# Patient Record
Sex: Female | Born: 1990
Health system: Southern US, Community
[De-identification: ages and names within clinical notes are randomized; demographics above are authoritative.]

## PROBLEM LIST (undated history)

## (undated) DIAGNOSIS — E079 Disorder of thyroid, unspecified: Secondary | ICD-10-CM

## (undated) DIAGNOSIS — K709 Alcoholic liver disease, unspecified: Secondary | ICD-10-CM

## (undated) DIAGNOSIS — N289 Disorder of kidney and ureter, unspecified: Secondary | ICD-10-CM

## (undated) DIAGNOSIS — R569 Unspecified convulsions: Secondary | ICD-10-CM

## (undated) DIAGNOSIS — D497 Neoplasm of unspecified behavior of endocrine glands and other parts of nervous system: Secondary | ICD-10-CM

## (undated) HISTORY — DX: Unspecified convulsions: R56.9

## (undated) HISTORY — PX: TONSILLECTOMY: SUR1361

## (undated) HISTORY — PX: CHOLECYSTECTOMY: SHX55

## (undated) HISTORY — DX: Alcoholic liver disease, unspecified: K70.9

---

## 2011-05-23 ENCOUNTER — Emergency Department: Payer: Self-pay | Admitting: Internal Medicine

## 2011-06-26 ENCOUNTER — Emergency Department: Payer: Self-pay | Admitting: Internal Medicine

## 2011-07-20 ENCOUNTER — Emergency Department: Payer: Self-pay | Admitting: Internal Medicine

## 2011-07-26 ENCOUNTER — Emergency Department: Payer: Self-pay | Admitting: Emergency Medicine

## 2011-10-13 ENCOUNTER — Emergency Department: Payer: Self-pay | Admitting: Emergency Medicine

## 2011-11-20 ENCOUNTER — Emergency Department: Payer: Self-pay | Admitting: Emergency Medicine

## 2012-01-02 DIAGNOSIS — E0520 Thyrotoxicosis with toxic multinodular goiter without thyrotoxic crisis or storm: Secondary | ICD-10-CM | POA: Insufficient documentation

## 2012-06-08 ENCOUNTER — Emergency Department: Payer: Self-pay | Admitting: Emergency Medicine

## 2013-03-17 ENCOUNTER — Ambulatory Visit: Payer: Self-pay

## 2013-03-17 LAB — URINALYSIS, COMPLETE
Glucose,UR: NEGATIVE mg/dL (ref 0–75)
Ketone: NEGATIVE
Nitrite: NEGATIVE
Ph: 6.5 (ref 4.5–8.0)
Protein: 30
Specific Gravity: >=1.03 (ref 1.003–1.030)

## 2013-03-17 LAB — CBC WITH DIFFERENTIAL/PLATELET
Basophil #: 0 10*3/uL (ref 0.0–0.1)
Basophil %: 0.5 %
Eosinophil #: 0.2 10*3/uL (ref 0.0–0.7)
Eosinophil %: 1.7 %
HCT: 42.2 % (ref 35.0–47.0)
HGB: 14.5 g/dL (ref 12.0–16.0)
Lymphocyte #: 1.5 10*3/uL (ref 1.0–3.6)
Lymphocyte %: 14.6 %
MCH: 29.1 pg (ref 26.0–34.0)
MCHC: 34.3 g/dL (ref 32.0–36.0)
MCV: 85 fL (ref 80–100)
Monocyte #: 0.5 x10 3/mm (ref 0.2–0.9)
Monocyte %: 5 %
Neutrophil #: 7.8 10*3/uL — ABNORMAL HIGH (ref 1.4–6.5)
Neutrophil %: 78.2 %
Platelet: 170 10*3/uL (ref 150–440)
RBC: 4.98 10*6/uL (ref 3.80–5.20)
RDW: 13.5 % (ref 11.5–14.5)
WBC: 10 10*3/uL (ref 3.6–11.0)

## 2013-03-17 LAB — COMPREHENSIVE METABOLIC PANEL
Albumin: 4.1 g/dL (ref 3.4–5.0)
Alkaline Phosphatase: 120 U/L (ref 50–136)
Anion Gap: 13 (ref 7–16)
BUN: 6 mg/dL — ABNORMAL LOW (ref 7–18)
Bilirubin,Total: 0.5 mg/dL (ref 0.2–1.0)
Calcium, Total: 9.2 mg/dL (ref 8.5–10.1)
Chloride: 106 mmol/L (ref 98–107)
Co2: 21 mmol/L (ref 21–32)
Creatinine: 0.67 mg/dL (ref 0.60–1.30)
EGFR (African American): >60
EGFR (Non-African Amer.): >60
Glucose: 112 mg/dL — ABNORMAL HIGH (ref 65–99)
Osmolality: 278 (ref 275–301)
Potassium: 4.7 mmol/L (ref 3.5–5.1)
SGOT(AST): 36 U/L (ref 15–37)
SGPT (ALT): 32 U/L (ref 12–78)
Sodium: 140 mmol/L (ref 136–145)
Total Protein: 7.7 g/dL (ref 6.4–8.2)

## 2013-03-17 LAB — PREGNANCY, URINE: Pregnancy Test, Urine: NEGATIVE m[IU]/mL

## 2013-03-19 LAB — URINE CULTURE

## 2013-04-10 ENCOUNTER — Ambulatory Visit: Payer: Self-pay | Admitting: Internal Medicine

## 2013-04-10 LAB — URINALYSIS, COMPLETE
Bilirubin,UR: NEGATIVE
Glucose,UR: NEGATIVE mg/dL (ref 0–75)
Ketone: NEGATIVE
Leukocyte Esterase: NEGATIVE
Nitrite: NEGATIVE
Ph: 6 (ref 4.5–8.0)
Specific Gravity: >=1.03 (ref 1.003–1.030)
WBC UR: NONE SEEN /HPF (ref 0–5)

## 2013-04-10 LAB — CBC WITH DIFFERENTIAL/PLATELET
Basophil #: 0 10*3/uL (ref 0.0–0.1)
Basophil %: 0.5 %
Eosinophil #: 0.5 10*3/uL (ref 0.0–0.7)
Eosinophil %: 8.1 %
HCT: 42.3 % (ref 35.0–47.0)
HGB: 14.4 g/dL (ref 12.0–16.0)
Lymphocyte #: 2 10*3/uL (ref 1.0–3.6)
Lymphocyte %: 29.7 %
MCH: 29.1 pg (ref 26.0–34.0)
MCHC: 34.1 g/dL (ref 32.0–36.0)
MCV: 85 fL (ref 80–100)
Monocyte #: 0.5 x10 3/mm (ref 0.2–0.9)
Monocyte %: 7.2 %
Neutrophil #: 3.6 10*3/uL (ref 1.4–6.5)
Neutrophil %: 54.5 %
Platelet: 168 10*3/uL (ref 150–440)
RBC: 4.96 10*6/uL (ref 3.80–5.20)
RDW: 13.4 % (ref 11.5–14.5)
WBC: 6.7 10*3/uL (ref 3.6–11.0)

## 2013-04-10 LAB — COMPREHENSIVE METABOLIC PANEL
Albumin: 3.9 g/dL (ref 3.4–5.0)
Alkaline Phosphatase: 109 U/L (ref 50–136)
Anion Gap: 10 (ref 7–16)
BUN: 6 mg/dL — ABNORMAL LOW (ref 7–18)
Bilirubin,Total: 0.4 mg/dL (ref 0.2–1.0)
Calcium, Total: 9.3 mg/dL (ref 8.5–10.1)
Chloride: 106 mmol/L (ref 98–107)
Co2: 24 mmol/L (ref 21–32)
Creatinine: 0.76 mg/dL (ref 0.60–1.30)
EGFR (African American): >60
EGFR (Non-African Amer.): >60
Glucose: 103 mg/dL — ABNORMAL HIGH (ref 65–99)
Osmolality: 277 (ref 275–301)
Potassium: 3.4 mmol/L — ABNORMAL LOW (ref 3.5–5.1)
SGOT(AST): 15 U/L (ref 15–37)
SGPT (ALT): 31 U/L (ref 12–78)
Sodium: 140 mmol/L (ref 136–145)
Total Protein: 7.3 g/dL (ref 6.4–8.2)

## 2013-04-10 LAB — PREGNANCY, URINE: Pregnancy Test, Urine: NEGATIVE m[IU]/mL

## 2013-04-13 DIAGNOSIS — N200 Calculus of kidney: Secondary | ICD-10-CM | POA: Insufficient documentation

## 2013-09-01 HISTORY — PX: OTHER SURGICAL HISTORY: SHX169

## 2013-09-28 ENCOUNTER — Emergency Department: Payer: Self-pay | Admitting: Emergency Medicine

## 2013-09-28 LAB — COMPREHENSIVE METABOLIC PANEL
Albumin: 3.5 g/dL (ref 3.4–5.0)
Alkaline Phosphatase: 101 U/L
Anion Gap: 4 — ABNORMAL LOW (ref 7–16)
BUN: 5 mg/dL — ABNORMAL LOW (ref 7–18)
Bilirubin,Total: 0.3 mg/dL (ref 0.2–1.0)
Calcium, Total: 8.4 mg/dL — ABNORMAL LOW (ref 8.5–10.1)
Chloride: 109 mmol/L — ABNORMAL HIGH (ref 98–107)
Co2: 27 mmol/L (ref 21–32)
Creatinine: 0.71 mg/dL (ref 0.60–1.30)
EGFR (African American): >60
EGFR (Non-African Amer.): >60
Glucose: 58 mg/dL — ABNORMAL LOW (ref 65–99)
Osmolality: 274 (ref 275–301)
Potassium: 4 mmol/L (ref 3.5–5.1)
SGOT(AST): 8 U/L — ABNORMAL LOW (ref 15–37)
SGPT (ALT): 16 U/L (ref 12–78)
Sodium: 140 mmol/L (ref 136–145)
Total Protein: 6.8 g/dL (ref 6.4–8.2)

## 2013-09-28 LAB — CBC WITH DIFFERENTIAL/PLATELET
Basophil #: 0 10*3/uL (ref 0.0–0.1)
Basophil %: 0.5 %
Eosinophil #: 0.5 10*3/uL (ref 0.0–0.7)
Eosinophil %: 10.5 %
HCT: 41.8 % (ref 35.0–47.0)
HGB: 14.5 g/dL (ref 12.0–16.0)
Lymphocyte #: 1.6 10*3/uL (ref 1.0–3.6)
Lymphocyte %: 31.2 %
MCH: 31 pg (ref 26.0–34.0)
MCHC: 34.7 g/dL (ref 32.0–36.0)
MCV: 89 fL (ref 80–100)
Monocyte #: 0.3 x10 3/mm (ref 0.2–0.9)
Monocyte %: 6.3 %
Neutrophil #: 2.7 10*3/uL (ref 1.4–6.5)
Neutrophil %: 51.5 %
Platelet: 163 10*3/uL (ref 150–440)
RBC: 4.68 10*6/uL (ref 3.80–5.20)
RDW: 13.2 % (ref 11.5–14.5)
WBC: 5.2 10*3/uL (ref 3.6–11.0)

## 2013-09-28 LAB — URINALYSIS, COMPLETE
Bacteria: NONE SEEN
Bilirubin,UR: NEGATIVE
Glucose,UR: NEGATIVE mg/dL (ref 0–75)
Ketone: NEGATIVE
Leukocyte Esterase: NEGATIVE
Nitrite: NEGATIVE
Ph: 7 (ref 4.5–8.0)
Protein: NEGATIVE
RBC,UR: 174 /HPF (ref 0–5)
Specific Gravity: 1.015 (ref 1.003–1.030)
Squamous Epithelial: 7
WBC UR: NONE SEEN /HPF (ref 0–5)

## 2013-09-28 LAB — HCG, QUANTITATIVE, PREGNANCY: Beta Hcg, Quant.: <1 m[IU]/mL — ABNORMAL LOW

## 2014-01-16 ENCOUNTER — Encounter: Payer: Self-pay | Admitting: Family Medicine

## 2014-01-30 ENCOUNTER — Encounter: Payer: Self-pay | Admitting: Family Medicine

## 2014-03-13 ENCOUNTER — Encounter (HOSPITAL_COMMUNITY): Payer: Self-pay | Admitting: Emergency Medicine

## 2014-03-13 ENCOUNTER — Emergency Department (HOSPITAL_COMMUNITY): Payer: BC Managed Care – PPO

## 2014-03-13 ENCOUNTER — Emergency Department (HOSPITAL_COMMUNITY)
Admission: EM | Admit: 2014-03-13 | Discharge: 2014-03-14 | Disposition: A | Discharge status: Home / Self Care | Payer: BC Managed Care – PPO | Attending: Emergency Medicine | Admitting: Emergency Medicine

## 2014-03-13 DIAGNOSIS — N83209 Unspecified ovarian cyst, unspecified side: Secondary | ICD-10-CM | POA: Diagnosis not present

## 2014-03-13 DIAGNOSIS — E079 Disorder of thyroid, unspecified: Secondary | ICD-10-CM | POA: Insufficient documentation

## 2014-03-13 DIAGNOSIS — N83202 Unspecified ovarian cyst, left side: Secondary | ICD-10-CM

## 2014-03-13 DIAGNOSIS — N949 Unspecified condition associated with female genital organs and menstrual cycle: Secondary | ICD-10-CM | POA: Insufficient documentation

## 2014-03-13 DIAGNOSIS — Z3202 Encounter for pregnancy test, result negative: Secondary | ICD-10-CM | POA: Insufficient documentation

## 2014-03-13 DIAGNOSIS — Z79899 Other long term (current) drug therapy: Secondary | ICD-10-CM | POA: Diagnosis not present

## 2014-03-13 DIAGNOSIS — IMO0002 Reserved for concepts with insufficient information to code with codable children: Secondary | ICD-10-CM | POA: Diagnosis not present

## 2014-03-13 DIAGNOSIS — F17200 Nicotine dependence, unspecified, uncomplicated: Secondary | ICD-10-CM | POA: Diagnosis not present

## 2014-03-13 DIAGNOSIS — R102 Pelvic and perineal pain: Secondary | ICD-10-CM

## 2014-03-13 DIAGNOSIS — N898 Other specified noninflammatory disorders of vagina: Secondary | ICD-10-CM | POA: Diagnosis present

## 2014-03-13 DIAGNOSIS — N939 Abnormal uterine and vaginal bleeding, unspecified: Secondary | ICD-10-CM

## 2014-03-13 HISTORY — DX: Disorder of thyroid, unspecified: E07.9

## 2014-03-13 LAB — CBC WITH DIFFERENTIAL/PLATELET
Basophils Absolute: 0 10*3/uL (ref 0.0–0.1)
Basophils Relative: 0 % (ref 0–1)
Eosinophils Absolute: 0.5 10*3/uL (ref 0.0–0.7)
Eosinophils Relative: 8 % — ABNORMAL HIGH (ref 0–5)
HCT: 42 % (ref 36.0–46.0)
Hemoglobin: 14.6 g/dL (ref 12.0–15.0)
Lymphocytes Relative: 40 % (ref 12–46)
Lymphs Abs: 2.6 10*3/uL (ref 0.7–4.0)
MCH: 31.1 pg (ref 26.0–34.0)
MCHC: 34.8 g/dL (ref 30.0–36.0)
MCV: 89.6 fL (ref 78.0–100.0)
Monocytes Absolute: 0.5 10*3/uL (ref 0.1–1.0)
Monocytes Relative: 7 % (ref 3–12)
Neutro Abs: 2.9 10*3/uL (ref 1.7–7.7)
Neutrophils Relative %: 45 % (ref 43–77)
Platelets: 181 10*3/uL (ref 150–400)
RBC: 4.69 MIL/uL (ref 3.87–5.11)
RDW: 12.4 % (ref 11.5–15.5)
WBC: 6.5 10*3/uL (ref 4.0–10.5)

## 2014-03-13 LAB — URINALYSIS, ROUTINE W REFLEX MICROSCOPIC
Bilirubin Urine: NEGATIVE
Glucose, UA: NEGATIVE mg/dL
Ketones, ur: NEGATIVE mg/dL
Leukocytes, UA: NEGATIVE
Nitrite: NEGATIVE
Specific Gravity, Urine: 1.015 (ref 1.005–1.030)
Urobilinogen, UA: 1 mg/dL (ref 0.0–1.0)
pH: 6 (ref 5.0–8.0)

## 2014-03-13 LAB — WET PREP, GENITAL
Trich, Wet Prep: NONE SEEN
Yeast Wet Prep HPF POC: NONE SEEN

## 2014-03-13 LAB — URINE MICROSCOPIC-ADD ON

## 2014-03-13 LAB — HCG, QUANTITATIVE, PREGNANCY: hCG, Beta Chain, Quant, S: <1 m[IU]/mL (ref <5)

## 2014-03-13 LAB — POC URINE PREG, ED: Preg Test, Ur: NEGATIVE

## 2014-03-13 LAB — ABO/RH: ABO/RH(D): B POS

## 2014-03-13 MED ORDER — KETOROLAC TROMETHAMINE 60 MG/2ML IM SOLN
30.0000 mg | Freq: Once | INTRAMUSCULAR | Status: DC
  Filled 2014-03-13: qty 2

## 2014-03-13 MED ORDER — HYDROCODONE-ACETAMINOPHEN 5-325 MG PO TABS
1.0000 | ORAL_TABLET | Freq: Once | ORAL | Status: AC
  Administered 2014-03-13: 1 via ORAL
  Filled 2014-03-13: qty 1

## 2014-03-13 NOTE — ED Provider Notes (Signed)
CSN: [id number]     Arrival date & time 03/13/14  2126 History   First MD Initiated Contact with Patient 03/13/14 2140     Chief Complaint  Patient presents with  . Vaginal Bleeding  . Flank Pain     (Consider location/radiation/quality/duration/timing/severity/associated sxs/prior Treatment) Patient is a 23 y.o. female presenting with vaginal bleeding and flank pain. The history is provided by the patient.  Vaginal Bleeding Quality:  Bright red Severity:  Moderate Onset quality:  Gradual Duration:  12 hours Timing:  Constant Progression:  Worsening Chronicity:  New Number of pads used:  6 pads today Possible pregnancy: no   Context: spontaneously   Relieved by:  None tried Associated symptoms: abdominal pain   Flank Pain Associated symptoms include abdominal pain.   Kelsey Donovan is a 23 y.o. female who presents to the ED with vaginal bleeding. She states that pain started about a week ago. She had been on Depo Provera for 6 years and her last shot was 10/2013. She had a normal period the first week in May that lasted a week and then again in June and July. Then the bleeding started back this morning.  Past Medical History  Diagnosis Date  . Thyroid disease    Past Surgical History  Procedure Laterality Date  . Tonsillectomy    . Gastric bybpass     History reviewed. No pertinent family history. History  Substance Use Topics  . Smoking status: Current Every Day Smoker  . Smokeless tobacco: Not on file  . Alcohol Use: No   OB History   Grav Para Term Preterm Abortions TAB SAB Ect Mult Living                 Review of Systems  Gastrointestinal: Positive for abdominal pain.  Genitourinary: Positive for flank pain, vaginal bleeding and pelvic pain.  All other systems negative    Allergies  Ibuprofen  Home Medications   Prior to Admission medications   Medication Sig Start Date End Date Taking? Authorizing Provider  acetaminophen (TYLENOL) 500 MG tablet  Take 1,000 mg by mouth every 6 (six) hours as needed for moderate pain.   Yes Historical Provider, MD  amphetamine-dextroamphetamine (ADDERALL) 20 MG tablet Take 1 tablet by mouth 2 (two) times daily. 02/23/14  Yes Historical Provider, MD  CALCIUM PO Take 1 tablet by mouth daily.   Yes Historical Provider, MD  citalopram (CELEXA) 20 MG tablet Take 1 tablet by mouth daily. 02/23/14  Yes Historical Provider, MD  fluticasone (FLONASE) 50 MCG/ACT nasal spray Place 2 sprays into both nostrils daily. 01/19/14  Yes Historical Provider, MD  folic acid (FOLVITE) 1 MG tablet Take 1 tablet by mouth daily. 12/21/13  Yes Historical Provider, MD  levothyroxine (SYNTHROID, LEVOTHROID) 125 MCG tablet Take 125 mcg by mouth daily before breakfast.   Yes Historical Provider, MD  methocarbamol (ROBAXIN) 750 MG tablet Take 1 tablet by mouth daily. 02/23/14  Yes Historical Provider, MD  Multiple Vitamin (MULTIVITAMIN WITH MINERALS) TABS tablet Take 2 tablets by mouth daily.   Yes Historical Provider, MD  Vitamin D, Ergocalciferol, (DRISDOL) 50000 UNITS CAPS capsule Take 50,000 Units by mouth every 7 (seven) days. On Wednesday.   Yes Historical Provider, MD   BP 97/53  Pulse 81  Temp(Src) 98.2 F (36.8 C) (Oral)  Resp 24  Ht 5\' 2"  (1.575 m)  Wt 145 lb (65.772 kg)  BMI 26.51 kg/m2  SpO2 99%  LMP 03/13/2014 Physical Exam  Nursing note and  vitals reviewed. Constitutional: She is oriented to person, place, and time. She appears well-developed and well-nourished.  HENT:  Head: Normocephalic.  Eyes: Conjunctivae and EOM are normal.  Neck: Neck supple.  Cardiovascular: Normal rate and regular rhythm.   Pulmonary/Chest: Effort normal. She has no wheezes. She has no rales.  Abdominal: Soft. There is no tenderness.  Genitourinary:  External genitalia without lesions, small blood vaginal vault. Yellow blood tinged mucous discharge from cervix. Positive CMT, bilateral adnexal tenderness, uterus without palpable enlargement.    Musculoskeletal: Normal range of motion.  Neurological: She is alert and oriented to person, place, and time. No cranial nerve deficit.  Skin: Skin is warm and dry.  Psychiatric: She has a normal mood and affect. Her behavior is normal.    ED Course  Procedures (including critical care time) Labs Review Results for orders placed during the hospital encounter of 03/13/14 (from the past 24 hour(s))  CBC WITH DIFFERENTIAL     Status: Abnormal   Collection Time    03/13/14  9:53 PM      Result Value Ref Range   WBC 6.5  4.0 - 10.5 K/uL   RBC 4.69  3.87 - 5.11 MIL/uL   Hemoglobin 14.6  12.0 - 15.0 g/dL   HCT 42.0  36.0 - 46.0 %   MCV 89.6  78.0 - 100.0 fL   MCH 31.1  26.0 - 34.0 pg   MCHC 34.8  30.0 - 36.0 g/dL   RDW 12.4  11.5 - 15.5 %   Platelets 181  150 - 400 K/uL   Neutrophils Relative % 45  43 - 77 %   Neutro Abs 2.9  1.7 - 7.7 K/uL   Lymphocytes Relative 40  12 - 46 %   Lymphs Abs 2.6  0.7 - 4.0 K/uL   Monocytes Relative 7  3 - 12 %   Monocytes Absolute 0.5  0.1 - 1.0 K/uL   Eosinophils Relative 8 (*) 0 - 5 %   Eosinophils Absolute 0.5  0.0 - 0.7 K/uL   Basophils Relative 0  0 - 1 %   Basophils Absolute 0.0  0.0 - 0.1 K/uL  HCG, QUANTITATIVE, PREGNANCY     Status: None   Collection Time    03/13/14  9:53 PM      Result Value Ref Range   hCG, Beta Chain, Quant, S <1  <5 mIU/mL  ABO/RH     Status: None   Collection Time    03/13/14  9:55 PM      Result Value Ref Range   ABO/RH(D) B POS    URINALYSIS, ROUTINE W REFLEX MICROSCOPIC     Status: Abnormal   Collection Time    03/13/14 10:12 PM      Result Value Ref Range   Color, Urine YELLOW  YELLOW   APPearance CLEAR  CLEAR   Specific Gravity, Urine 1.015  1.005 - 1.030   pH 6.0  5.0 - 8.0   Glucose, UA NEGATIVE  NEGATIVE mg/dL   Hgb urine dipstick LARGE (*) NEGATIVE   Bilirubin Urine NEGATIVE  NEGATIVE   Ketones, ur NEGATIVE  NEGATIVE mg/dL   Protein, ur TRACE (*) NEGATIVE mg/dL   Urobilinogen, UA 1.0  0.0 -  1.0 mg/dL   Nitrite NEGATIVE  NEGATIVE   Leukocytes, UA NEGATIVE  NEGATIVE  URINE MICROSCOPIC-ADD ON     Status: Abnormal   Collection Time    03/13/14 10:12 PM      Result Value Ref Range  Squamous Epithelial / LPF RARE  RARE   WBC, UA 0-2  <3 WBC/hpf   RBC / HPF TOO NUMEROUS TO COUNT  <3 RBC/hpf   Bacteria, UA FEW (*) RARE  POC URINE PREG, ED     Status: None   Collection Time    03/13/14 10:14 PM      Result Value Ref Range   Preg Test, Ur NEGATIVE  NEGATIVE  WET PREP, GENITAL     Status: Abnormal   Collection Time    03/13/14 10:29 PM      Result Value Ref Range   Yeast Wet Prep HPF POC NONE SEEN  NONE SEEN   Trich, Wet Prep NONE SEEN  NONE SEEN   Clue Cells Wet Prep HPF POC FEW (*) NONE SEEN   WBC, Wet Prep HPF POC FEW (*) NONE SEEN    Ct Abdomen Pelvis Wo Contrast  03/14/2014   CLINICAL DATA:  Vaginal bleeding.  Flank pain.  EXAM: CT ABDOMEN AND PELVIS WITHOUT CONTRAST  TECHNIQUE: Multidetector CT imaging of the abdomen and pelvis was performed following the standard protocol without IV contrast.  COMPARISON:  Prior CT from 09/28/2013  FINDINGS: The visualized lung bases are clear.  Limited noncontrast evaluation of the liver is unremarkable. Gallbladder within normal limits. No biliary ductal dilatation. The spleen, adrenal glands, and pancreas demonstrate a normal unenhanced appearance.  Kidneys are within normal limits without evidence of nephrolithiasis or hydronephrosis. No stones seen along the course of either renal collecting system. There is no hydroureter.  Sequelae of prior Roux-en-Y gastric bypass noted. No evidence of bowel obstruction. No acute inflammatory changes seen within the right lower quadrant or about the cecum to suggest acute appendicitis. No abnormal wall thickening or inflammatory fat stranding seen about the bowels.  Bladder within normal limits. Uterus is unremarkable. Probable physiologic 3.2 cm cyst present within the left ovary. Right ovary is  unremarkable.  Small volume free fluid present within the pelvic cul-de-sac, likely physiologic. No free intraperitoneal air. No adenopathy.  No acute osseus abnormality. No worrisome lytic or blastic osseous lesions.  IMPRESSION: 1. No CT evidence of nephrolithiasis or obstructive uropathy. 2. 3.2 cm left ovarian cyst with small volume free fluid within the pelvis, likely physiologic. 3. No other acute intra-abdominal pelvic process.   Electronically Signed   By: Jeannine Boga M.D.   On: 03/14/2014 00:39    MDM  23 y.o. female with pelvic pain and vaginal bleeding after stopping Depo Provera 10/2013. Will treat for possible PID while cultures pending. Will treat for pain. She is to follow up with her GYN to discuss her abnormal bleeding that is possibly related to stopping the Depo. Stable for discharge without heavy bleeding, fever, dizziness or other problems. I have reviewed this patient's vital signs, nurses notes, appropriate labs and imaging.  I have discussed findings and plan of care with the patient and she voices understanding and agrees with the plan.    Medication List    TAKE these medications       HYDROcodone-acetaminophen 5-325 MG per tablet  Commonly known as:  NORCO/VICODIN  Take 1 tablet by mouth every 4 (four) hours as needed.     metroNIDAZOLE 500 MG tablet  Commonly known as:  FLAGYL  Take 1 tablet (500 mg total) by mouth 2 (two) times daily.     ondansetron 4 MG disintegrating tablet  Commonly known as:  ZOFRAN ODT  Take 1 tablet (4 mg total) by mouth every 8 (  eight) hours as needed for nausea or vomiting.      ASK your doctor about these medications       acetaminophen 500 MG tablet  Commonly known as:  TYLENOL  Take 1,000 mg by mouth every 6 (six) hours as needed for moderate pain.     amphetamine-dextroamphetamine 20 MG tablet  Commonly known as:  ADDERALL  Take 1 tablet by mouth 2 (two) times daily.     CALCIUM PO  Take 1 tablet by mouth daily.      citalopram 20 MG tablet  Commonly known as:  CELEXA  Take 1 tablet by mouth daily.     fluticasone 50 MCG/ACT nasal spray  Commonly known as:  FLONASE  Place 2 sprays into both nostrils daily.     folic acid 1 MG tablet  Commonly known as:  FOLVITE  Take 1 tablet by mouth daily.     levothyroxine 125 MCG tablet  Commonly known as:  SYNTHROID, LEVOTHROID  Take 125 mcg by mouth daily before breakfast.     methocarbamol 750 MG tablet  Commonly known as:  ROBAXIN  Take 1 tablet by mouth daily.     multivitamin with minerals Tabs tablet  Take 2 tablets by mouth daily.     Vitamin D (Ergocalciferol) 50000 UNITS Caps capsule  Commonly known as:  DRISDOL  Take 50,000 Units by mouth every 7 (seven) days. On Wednesday.           [place], [place] 03/15/14 [phone]

## 2014-03-13 NOTE — ED Notes (Signed)
Pt having nausea x 2 week, last week pain in back radiating around to right side, Today heavy vaginal bleeding.

## 2014-03-14 LAB — RPR

## 2014-03-14 LAB — HIV ANTIBODY (ROUTINE TESTING W REFLEX): HIV 1&2 Ab, 4th Generation: NONREACTIVE

## 2014-03-14 MED ORDER — METRONIDAZOLE 500 MG PO TABS: 500.0000 mg | ORAL_TABLET | Freq: Two times a day (BID) | ORAL | Status: DC

## 2014-03-14 MED ORDER — ONDANSETRON 4 MG PO TBDP: 4.0000 mg | ORAL_TABLET | Freq: Three times a day (TID) | ORAL | Status: DC | PRN

## 2014-03-14 MED ORDER — HYDROCODONE-ACETAMINOPHEN 5-325 MG PO TABS: 1.0000 | ORAL_TABLET | ORAL | Status: DC | PRN

## 2014-03-14 MED ORDER — ONDANSETRON 4 MG PO TBDP
4.0000 mg | ORAL_TABLET | Freq: Once | ORAL | Status: AC
  Administered 2014-03-14: 4 mg via ORAL

## 2014-03-14 MED ORDER — ONDANSETRON 4 MG PO TBDP
4.0000 mg | ORAL_TABLET | Freq: Three times a day (TID) | ORAL | Status: DC | PRN
Start: 2014-03-14 — End: 2014-06-23

## 2014-03-14 MED ORDER — AZITHROMYCIN 250 MG PO TABS
1000.0000 mg | ORAL_TABLET | Freq: Every day | ORAL | Status: DC
  Filled 2014-03-14: qty 4

## 2014-03-14 MED ORDER — LIDOCAINE HCL (PF) 1 % IJ SOLN
INTRAMUSCULAR | Status: AC
  Administered 2014-03-14: 0.9 mL
  Filled 2014-03-14: qty 5

## 2014-03-14 MED ORDER — AZITHROMYCIN 250 MG PO TABS
1000.0000 mg | ORAL_TABLET | Freq: Once | ORAL | Status: AC
  Administered 2014-03-14: 1000 mg via ORAL

## 2014-03-14 MED ORDER — CEFTRIAXONE SODIUM 250 MG IJ SOLR
250.0000 mg | Freq: Once | INTRAMUSCULAR | Status: AC
  Administered 2014-03-14: 250 mg via INTRAMUSCULAR
  Filled 2014-03-14: qty 250

## 2014-03-14 MED ORDER — ONDANSETRON 4 MG PO TBDP
ORAL_TABLET | ORAL | Status: AC
  Filled 2014-03-14: qty 1

## 2014-03-14 NOTE — Discharge Instructions (Signed)
Dysfunctional Uterine Bleeding Normally, menstrual periods begin between ages 11 to 17 in young women. A normal menstrual cycle/period may begin every 23 days up to 35 days and lasts from 1 to 7 days. Around 12 to 14 days before your menstrual period starts, ovulation (ovary produces an egg) occurs. When counting the time between menstrual periods, count from the first day of bleeding of the previous period to the first day of bleeding of the next period. Dysfunctional (abnormal) uterine bleeding is bleeding that is different from a normal menstrual period. Your periods may come earlier or later than usual. They may be lighter, have blood clots or be heavier. You may have bleeding between periods, or you may skip one period or more. You may have bleeding after sexual intercourse, bleeding after menopause, or no menstrual period. CAUSES   Pregnancy (normal, miscarriage, tubal).  IUDs (intrauterine device, birth control).  Birth control pills.  Hormone treatment.  Menopause.  Infection of the cervix.  Blood clotting problems.  Infection of the inside lining of the uterus.  Endometriosis, inside lining of the uterus growing in the pelvis and other female organs.  Adhesions (scar tissue) inside the uterus.  Obesity or severe weight loss.  Uterine polyps inside the uterus.  Cancer of the vagina, cervix, or uterus.  Ovarian cysts or polycystic ovary syndrome.  Medical problems (diabetes, thyroid disease).  Uterine fibroids (noncancerous tumor).  Problems with your female hormones.  Endometrial hyperplasia, very thick lining and enlarged cells inside of the uterus.  Medicines that interfere with ovulation.  Radiation to the pelvis or abdomen.  Chemotherapy. DIAGNOSIS   Your doctor will discuss the history of your menstrual periods, medicines you are taking, changes in your weight, stress in your life, and any medical problems you may have.  Your doctor will do a physical  and pelvic examination.  Your doctor may want to perform certain tests to make a diagnosis, such as:  Pap test.  Blood tests.  Cultures for infection.  CT scan.  Ultrasound.  Hysteroscopy.  Laparoscopy.  MRI.  Hysterosalpingography.  D and C.  Endometrial biopsy. TREATMENT  Treatment will depend on the cause of the dysfunctional uterine bleeding (DUB). Treatment may include:  Observing your menstrual periods for a couple of months.  Prescribing medicines for medical problems, including:  Antibiotics.  Hormones.  Birth control pills.  Removing an IUD (intrauterine device, birth control).  Surgery:  D and C (scrape and remove tissue from inside the uterus).  Laparoscopy (examine inside the abdomen with a lighted tube).  Uterine ablation (destroy lining of the uterus with electrical current, laser, heat, or freezing).  Hysteroscopy (examine cervix and uterus with a lighted tube).  Hysterectomy (remove the uterus). HOME CARE INSTRUCTIONS   If medicines were prescribed, take exactly as directed. Do not change or switch medicines without consulting your caregiver.  Long term heavy bleeding may result in iron deficiency. Your caregiver may have prescribed iron pills. They help replace the iron that your body lost from heavy bleeding. Take exactly as directed.  Do not take aspirin or medicines that contain aspirin one week before or during your menstrual period. Aspirin may make the bleeding worse.  If you need to change your sanitary pad or tampon more than once every 2 hours, stay in bed with your feet elevated and a cold pack on your lower abdomen. Rest as much as possible, until the bleeding stops or slows down.  Eat well-balanced meals. Eat foods high in iron. Examples   are:  Leafy green vegetables.  Whole-grain breads and cereals.  Eggs.  Meat.  Liver.  Do not try to lose weight until the abnormal bleeding has stopped and your blood iron level is  back to normal. Do not lift more than ten pounds or do strenuous activities when you are bleeding.  For a couple of months, make note on your calendar, marking the start and ending of your period, and the type of bleeding (light, medium, heavy, spotting, clots or missed periods). This is for your caregiver to better evaluate your problem. SEEK MEDICAL CARE IF:   You develop nausea (feeling sick to your stomach) and vomiting, dizziness, or diarrhea while you are taking your medicine.  You are getting lightheaded or weak.  You have any problems that may be related to the medicine you are taking.  You develop pain with your DUB.  You want to remove your IUD.  You want to stop or change your birth control pills or hormones.  You have any type of abnormal bleeding mentioned above.  You are over 16 years old and have not had a menstrual period yet.  You are 23 years old and you are still having menstrual periods.  You have any of the symptoms mentioned above.  You develop a rash. SEEK IMMEDIATE MEDICAL CARE IF:   An oral temperature above 102 F (38.9 C) develops.  You develop chills.  You are changing your sanitary pad or tampon more than once an hour.  You develop abdominal pain.  You pass out or faint. Document Released: 08/15/2000 Document Revised: 11/10/2011 Document Reviewed: 07/17/2009 Exit[employer] Patient Information 2015 [employer]. This information is not intended to replace advice given to you by your health care provider. Make sure you discuss any questions you have with your health care provider.  

## 2014-03-15 LAB — GC/CHLAMYDIA PROBE AMP
CT Probe RNA: NEGATIVE
GC Probe RNA: NEGATIVE

## 2014-03-16 NOTE — ED Provider Notes (Signed)
Medical screening examination/treatment/procedure(s) were performed by non-physician practitioner and as supervising physician I was immediately available for consultation/collaboration.   EKG Interpretation None        Alfonzo Feller, DO 03/16/14 1430

## 2014-03-20 MED FILL — Ondansetron HCl Tab 4 MG: ORAL | Qty: 4 | Status: AC

## 2014-06-23 ENCOUNTER — Emergency Department (HOSPITAL_COMMUNITY): Payer: BC Managed Care – PPO

## 2014-06-23 ENCOUNTER — Emergency Department (HOSPITAL_COMMUNITY)
Admission: EM | Admit: 2014-06-23 | Discharge: 2014-06-23 | Disposition: A | Discharge status: Home / Self Care | Payer: BC Managed Care – PPO | Attending: Emergency Medicine | Admitting: Emergency Medicine

## 2014-06-23 ENCOUNTER — Encounter (HOSPITAL_COMMUNITY): Payer: Self-pay | Admitting: Emergency Medicine

## 2014-06-23 DIAGNOSIS — E079 Disorder of thyroid, unspecified: Secondary | ICD-10-CM | POA: Diagnosis not present

## 2014-06-23 DIAGNOSIS — Z79899 Other long term (current) drug therapy: Secondary | ICD-10-CM | POA: Diagnosis not present

## 2014-06-23 DIAGNOSIS — Z7951 Long term (current) use of inhaled steroids: Secondary | ICD-10-CM | POA: Diagnosis not present

## 2014-06-23 DIAGNOSIS — R0789 Other chest pain: Secondary | ICD-10-CM | POA: Diagnosis not present

## 2014-06-23 DIAGNOSIS — R0981 Nasal congestion: Secondary | ICD-10-CM | POA: Diagnosis not present

## 2014-06-23 DIAGNOSIS — R05 Cough: Secondary | ICD-10-CM | POA: Diagnosis present

## 2014-06-23 DIAGNOSIS — Z720 Tobacco use: Secondary | ICD-10-CM | POA: Insufficient documentation

## 2014-06-23 DIAGNOSIS — J9801 Acute bronchospasm: Secondary | ICD-10-CM

## 2014-06-23 MED ORDER — AZITHROMYCIN 250 MG PO TABS: 250.0000 mg | ORAL_TABLET | Freq: Every day | ORAL | Status: DC

## 2014-06-23 MED ORDER — IPRATROPIUM-ALBUTEROL 0.5-2.5 (3) MG/3ML IN SOLN
3.0000 mL | Freq: Once | RESPIRATORY_TRACT | Status: AC
  Administered 2014-06-23: 3 mL via RESPIRATORY_TRACT
  Filled 2014-06-23: qty 3

## 2014-06-23 MED ORDER — ALBUTEROL SULFATE HFA 108 (90 BASE) MCG/ACT IN AERS
INHALATION_SPRAY | RESPIRATORY_TRACT | Status: AC
  Administered 2014-06-23: 2 via RESPIRATORY_TRACT
  Filled 2014-06-23: qty 6.7

## 2014-06-23 MED ORDER — PREDNISONE 50 MG PO TABS
60.0000 mg | ORAL_TABLET | Freq: Once | ORAL | Status: AC
  Administered 2014-06-23: 60 mg via ORAL
  Filled 2014-06-23 (×2): qty 1

## 2014-06-23 MED ORDER — GUAIFENESIN-CODEINE 100-10 MG/5ML PO SYRP: 10.0000 mL | ORAL_SOLUTION | Freq: Three times a day (TID) | ORAL | Status: DC | PRN

## 2014-06-23 MED ORDER — ALBUTEROL SULFATE (2.5 MG/3ML) 0.083% IN NEBU
2.5000 mg | INHALATION_SOLUTION | Freq: Once | RESPIRATORY_TRACT | Status: AC
  Administered 2014-06-23: 2.5 mg via RESPIRATORY_TRACT
  Filled 2014-06-23: qty 3

## 2014-06-23 MED ORDER — PREDNISONE 10 MG PO TABS: ORAL_TABLET | ORAL | Status: DC

## 2014-06-23 MED ORDER — ALBUTEROL SULFATE HFA 108 (90 BASE) MCG/ACT IN AERS
2.0000 | INHALATION_SPRAY | Freq: Once | RESPIRATORY_TRACT | Status: AC
  Administered 2014-06-23: 2 via RESPIRATORY_TRACT

## 2014-06-23 NOTE — ED Provider Notes (Signed)
CSN: [id number]     Arrival date & time 06/23/14  0908 History   First MD Initiated Contact with Patient 06/23/14 0915     Chief Complaint  Patient presents with  . Cough     (Consider location/radiation/quality/duration/timing/severity/associated sxs/prior Treatment) HPI   Annarose L Maldonado is a 23 y.o. female who presents to the Emergency Department complaining of cough and nasal congestion for four days.  She states the nasal congestion seems to be improving after OTC cold medications, but cough has been persistent and occassionally productive of yellow sputum.  She also reports chest tightness and wheezes that worsens with excessive coughing or deep breathing.  She denies shortness of breath, fever, chills, vomiting, or bloody sputum.  She reports h/o asthma and uses a maintanince inhale, but has ran out of her albuterol.    Past Medical History  Diagnosis Date  . Thyroid disease    Past Surgical History  Procedure Laterality Date  . Tonsillectomy    . Gastric bybpass     No family history on file. History  Substance Use Topics  . Smoking status: Current Every Day Smoker  . Smokeless tobacco: Not on file  . Alcohol Use: No   OB History   Grav Para Term Preterm Abortions TAB SAB Ect Mult Living                 Review of Systems  Constitutional: Negative for activity change and appetite change.  HENT: Positive for congestion. Negative for facial swelling and trouble swallowing.   Eyes: Negative for visual disturbance.  Respiratory: Positive for chest tightness. Negative for stridor.   Gastrointestinal: Negative for nausea and vomiting.  Genitourinary: Negative for dysuria.  Musculoskeletal: Negative for neck pain and neck stiffness.  Skin: Negative.   Neurological: Negative for dizziness, weakness and numbness.  Hematological: Negative for adenopathy.  Psychiatric/Behavioral: Negative for confusion.  All other systems reviewed and are negative.     Allergies   Ibuprofen  Home Medications   Prior to Admission medications   Medication Sig Start Date End Date Taking? Authorizing Provider  acetaminophen (TYLENOL) 500 MG tablet Take 1,000 mg by mouth every 6 (six) hours as needed for moderate pain.    Historical Provider, MD  amphetamine-dextroamphetamine (ADDERALL) 20 MG tablet Take 1 tablet by mouth 2 (two) times daily. 02/23/14   Historical Provider, MD  CALCIUM PO Take 1 tablet by mouth daily.    Historical Provider, MD  citalopram (CELEXA) 20 MG tablet Take 1 tablet by mouth daily. 02/23/14   Historical Provider, MD  fluticasone (FLONASE) 50 MCG/ACT nasal spray Place 2 sprays into both nostrils daily. 01/19/14   Historical Provider, MD  folic acid (FOLVITE) 1 MG tablet Take 1 tablet by mouth daily. 12/21/13   Historical Provider, MD  HYDROcodone-acetaminophen (NORCO/VICODIN) 5-325 MG per tablet Take 1 tablet by mouth every 4 (four) hours as needed. 1/57/26   Delora Fuel, MD  levothyroxine (SYNTHROID, LEVOTHROID) 125 MCG tablet Take 125 mcg by mouth daily before breakfast.    Historical Provider, MD  methocarbamol (ROBAXIN) 750 MG tablet Take 1 tablet by mouth daily. 02/23/14   Historical Provider, MD  metroNIDAZOLE (FLAGYL) 500 MG tablet Take 1 tablet (500 mg total) by mouth 2 (two) times daily. 10/04/53   Delora Fuel, MD  Multiple Vitamin (MULTIVITAMIN WITH MINERALS) TABS tablet Take 2 tablets by mouth daily.    Historical Provider, MD  ondansetron (ZOFRAN ODT) 4 MG disintegrating tablet Take 1 tablet (4 mg total) by  mouth every 8 (eight) hours as needed for nausea or vomiting. 2/63/33   Delora Fuel, MD  Vitamin D, Ergocalciferol, (DRISDOL) 50000 UNITS CAPS capsule Take 50,000 Units by mouth every 7 (seven) days. On Wednesday.    Historical Provider, MD   BP 101/66  Pulse 80  Temp(Src) 98.1 F (36.7 C) (Oral)  Resp 18  Ht 5\' 3"  (1.6 m)  Wt 140 lb (63.504 kg)  BMI 24.81 kg/m2  SpO2 100%  LMP 06/15/2014 Physical Exam  Nursing note and vitals  reviewed. Constitutional: She is oriented to person, place, and time. She appears well-developed and well-nourished. No distress.  HENT:  Head: Normocephalic and atraumatic.  Right Ear: Tympanic membrane and ear canal normal.  Left Ear: Tympanic membrane and ear canal normal.  Mouth/Throat: Uvula is midline, oropharynx is clear and moist and mucous membranes are normal. No oropharyngeal exudate.  Eyes: EOM are normal. Pupils are equal, round, and reactive to light.  Neck: Normal range of motion, full passive range of motion without pain and phonation normal. Neck supple.  Cardiovascular: Normal rate, regular rhythm, normal heart sounds and intact distal pulses.   No murmur heard. Pulmonary/Chest: Effort normal. No stridor. No respiratory distress. She has wheezes. She has no rales. She exhibits no tenderness.  Breath sounds diminished bilaterally with inspiratory and expiratory wheezes. No rales  Musculoskeletal: Normal range of motion. She exhibits no edema.  Lymphadenopathy:    She has no cervical adenopathy.  Neurological: She is alert and oriented to person, place, and time. She exhibits normal muscle tone. Coordination normal.  Skin: Skin is warm and dry.    ED Course  Procedures (including critical care time) Labs Review Labs Reviewed - No data to display  Imaging Review Dg Chest 2 View  06/23/2014   CLINICAL DATA:  Cough, fever.  EXAM: CHEST  2 VIEW  COMPARISON:  October 13, 2011.  FINDINGS: The heart size and mediastinal contours are within normal limits. Both lungs are clear. No pneumothorax or pleural effusion is noted. The visualized skeletal structures are unremarkable.  IMPRESSION: No acute cardiopulmonary abnormality seen.   Electronically Signed   By: Sabino Dick M.D.   On: 06/23/2014 09:37     EKG Interpretation None      MDM   Final diagnoses:  Cough due to bronchospasm    Pt is feeling better, wheezing and lung sounds improved after neb.  Inhaler  dispensed for home use.  Rx for prednisone, z pack and robitussin AC for cough.  VSS.  Clinical suspicion for PE is low.  Pt appears stable for d/c and agrees to return here for any worsening sx's.    Melanye Hiraldo L. Vanessa [place], PA-C 06/24/14 2103

## 2014-06-23 NOTE — ED Notes (Signed)
RT paged.

## 2014-06-23 NOTE — ED Notes (Signed)
Cough x 4 days. Fever last night. Took Tylenol at 0400

## 2014-06-27 NOTE — ED Provider Notes (Signed)
Medical screening examination/treatment/procedure(s) were performed by non-physician practitioner and as supervising physician I was immediately available for consultation/collaboration.   EKG Interpretation None        Maudry Diego, MD 06/27/14 [phone]

## 2014-10-14 ENCOUNTER — Emergency Department (HOSPITAL_COMMUNITY)
Admission: EM | Admit: 2014-10-14 | Discharge: 2014-10-14 | Disposition: A | Discharge status: Home / Self Care | Payer: Self-pay | Attending: Emergency Medicine | Admitting: Emergency Medicine

## 2014-10-14 ENCOUNTER — Encounter (HOSPITAL_COMMUNITY): Payer: Self-pay

## 2014-10-14 DIAGNOSIS — M62830 Muscle spasm of back: Secondary | ICD-10-CM | POA: Insufficient documentation

## 2014-10-14 DIAGNOSIS — Z79899 Other long term (current) drug therapy: Secondary | ICD-10-CM | POA: Insufficient documentation

## 2014-10-14 DIAGNOSIS — Z792 Long term (current) use of antibiotics: Secondary | ICD-10-CM | POA: Insufficient documentation

## 2014-10-14 DIAGNOSIS — E079 Disorder of thyroid, unspecified: Secondary | ICD-10-CM | POA: Insufficient documentation

## 2014-10-14 DIAGNOSIS — Z720 Tobacco use: Secondary | ICD-10-CM | POA: Insufficient documentation

## 2014-10-14 LAB — URINALYSIS, ROUTINE W REFLEX MICROSCOPIC
Bilirubin Urine: NEGATIVE
Glucose, UA: NEGATIVE mg/dL
Ketones, ur: NEGATIVE mg/dL
Leukocytes, UA: NEGATIVE
Nitrite: NEGATIVE
Protein, ur: NEGATIVE mg/dL
Specific Gravity, Urine: >1.03 — ABNORMAL HIGH (ref 1.005–1.030)
Urobilinogen, UA: 0.2 mg/dL (ref 0.0–1.0)
pH: 5.5 (ref 5.0–8.0)

## 2014-10-14 LAB — URINE MICROSCOPIC-ADD ON

## 2014-10-14 LAB — PREGNANCY, URINE: Preg Test, Ur: NEGATIVE

## 2014-10-14 MED ORDER — ONDANSETRON 4 MG PO TBDP
ORAL_TABLET | ORAL | Status: AC
  Administered 2014-10-14: 4 mg
  Filled 2014-10-14: qty 1

## 2014-10-14 MED ORDER — TRAMADOL HCL 50 MG PO TABS: 100.0000 mg | ORAL_TABLET | Freq: Four times a day (QID) | ORAL | Status: DC | PRN

## 2014-10-14 MED ORDER — DEXAMETHASONE SODIUM PHOSPHATE 4 MG/ML IJ SOLN
10.0000 mg | Freq: Once | INTRAMUSCULAR | Status: AC
  Administered 2014-10-14: 10 mg via INTRAMUSCULAR
  Filled 2014-10-14: qty 3

## 2014-10-14 MED ORDER — FENTANYL CITRATE 0.05 MG/ML IJ SOLN
50.0000 ug | Freq: Once | INTRAMUSCULAR | Status: AC
  Administered 2014-10-14: 50 ug via INTRAMUSCULAR
  Filled 2014-10-14: qty 2

## 2014-10-14 MED ORDER — DIAZEPAM 5 MG/ML IJ SOLN
10.0000 mg | Freq: Once | INTRAMUSCULAR | Status: AC
  Administered 2014-10-14: 10 mg via INTRAMUSCULAR
  Filled 2014-10-14: qty 2

## 2014-10-14 MED ORDER — CYCLOBENZAPRINE HCL 10 MG PO TABS: 10.0000 mg | ORAL_TABLET | Freq: Three times a day (TID) | ORAL | Status: DC | PRN

## 2014-10-14 MED ORDER — PREDNISONE 20 MG PO TABS: ORAL_TABLET | ORAL | Status: DC

## 2014-10-14 NOTE — ED Notes (Signed)
Pt c/o right lower back pain since yesterday, states she has frequency with urination, denies dysuria

## 2014-10-14 NOTE — Discharge Instructions (Signed)
Use ice and heat on the sore back muscles. Take the medications as prescribed with acetaminophen 1000 mg 4 times a day. Have your doctor recheck you if you aren't improving in the next week.

## 2014-10-14 NOTE — ED Provider Notes (Signed)
CSN: [id number]     Arrival date & time 10/14/14  0539 History   First MD Initiated Contact with Patient 10/14/14 [phone]     Chief Complaint  Patient presents with  . Back Pain     (Consider location/radiation/quality/duration/timing/severity/associated sxs/prior Treatment) HPI  Patient lives in [place]. Patient reports about 5 PM she started getting pain in the middle of her back that radiates to her right lateral\abdomen. She states bending, lifting, and turning makes the pain worse. Laying on her back or elevating her back makes it feel better. She denies any known injury but she works at [employer] place and does a lot of heavy lifting. The pain does not radiate. She describes the pain as sharp. She has had some nausea without vomiting. She states she's had this pain before when she had a kidney stone and then when she had a cyst on her ovary. She denies dysuria or frequency but she does have urgency however she then cannot urinate. She denies any fever. She denies any vaginal discharge. She denies any pain in her abdomen.  PCP [facility] [place]  Past Medical History  Diagnosis Date  . Thyroid disease    Past Surgical History  Procedure Laterality Date  . Tonsillectomy    . Gastric bybpass     No family history on file. History  Substance Use Topics  . Smoking status: Current Every Day Smoker  . Smokeless tobacco: Not on file  . Alcohol Use: No   employed  OB History    No data available     Review of Systems  All other systems reviewed and are negative.     Allergies  Ibuprofen and Nsaids Told to avoid because of her gastric bypass   Home Medications   Prior to Admission medications   Medication Sig Start Date End Date Taking? Authorizing Provider  acetaminophen (TYLENOL) 500 MG tablet Take 1,000 mg by mouth every 6 (six) hours as needed for moderate pain.    Historical Provider, MD  amphetamine-dextroamphetamine (ADDERALL) 30 MG tablet Take 30  mg by mouth 2 (two) times daily.    Historical Provider, MD  azithromycin (ZITHROMAX) 250 MG tablet Take 1 tablet (250 mg total) by mouth daily. Take first 2 tablets together, then 1 every day until finished. 06/23/14   Tammy L. Triplett, PA-C  DULoxetine (CYMBALTA) 20 MG capsule Take 20 mg by mouth daily.    Historical Provider, MD  folic acid (FOLVITE) 1 MG tablet Take 1 tablet by mouth daily. 12/21/13   Historical Provider, MD  guaiFENesin-codeine (ROBITUSSIN AC) 100-10 MG/5ML syrup Take 10 mLs by mouth 3 (three) times daily as needed. 06/23/14   Tammy L. Triplett, PA-C  HYDROcodone-acetaminophen (NORCO/VICODIN) 5-325 MG per tablet Take 1 tablet by mouth every 4 (four) hours as needed (pain).    Historical Provider, MD  levothyroxine (SYNTHROID, LEVOTHROID) 125 MCG tablet Take 125 mcg by mouth daily before breakfast.    Historical Provider, MD  Multiple Vitamin (MULTIVITAMIN WITH MINERALS) TABS tablet Take 2 tablets by mouth daily.    Historical Provider, MD  predniSONE (DELTASONE) 10 MG tablet Take 6 tablets day one, 5 tablets day two, 4 tablets day three, 3 tablets day four, 2 tablets day five, then 1 tablet day six 06/23/14   Tammy L. Triplett, PA-C  Vitamin D, Ergocalciferol, (DRISDOL) 50000 UNITS CAPS capsule Take 50,000 Units by mouth every 7 (seven) days. On Wednesday.    Historical Provider, MD   BP [phone]  mmHg  Pulse 78  Temp(Src) 97.7 F (36.5 C) (Oral)  Resp 18  Ht 5\' 3"  (1.6 m)  Wt 140 lb (63.504 kg)  BMI 24.81 kg/m2  SpO2 100%  LMP 10/11/2014  Vital signs normal   Physical Exam  Constitutional: She is oriented to person, place, and time. She appears well-developed and well-nourished.  Non-toxic appearance. She does not appear ill. No distress.  Patient laying with a pillow underneath her right side.  HENT:  Head: Normocephalic and atraumatic.  Right Ear: External ear normal.  Left Ear: External ear normal.  Nose: Nose normal. No mucosal edema or rhinorrhea.   Mouth/Throat: Oropharynx is clear and moist and mucous membranes are normal. No dental abscesses or uvula swelling.  Eyes: Conjunctivae and EOM are normal. Pupils are equal, round, and reactive to light.  Neck: Normal range of motion and full passive range of motion without pain. Neck supple.  Cardiovascular: Normal rate, regular rhythm and normal heart sounds.  Exam reveals no gallop and no friction rub.   No murmur heard. Pulmonary/Chest: Effort normal and breath sounds normal. No respiratory distress. She has no wheezes. She has no rhonchi. She has no rales. She exhibits no tenderness and no crepitus.  Abdominal: Soft. Normal appearance and bowel sounds are normal. She exhibits no distension. There is no tenderness. There is no rebound and no guarding.  Musculoskeletal: Normal range of motion. She exhibits no edema or tenderness.  Moves all extremities well.  When patient does straight leg raising on the right it causes a pulling in her lateral right abdomen/back. There is no discomfort with straight leg raising on the left. On range of motion at her waist leaning to the right makes her pain feel better, leaning to the left makes it feel worse, leaning forward makes it hurt more. Patient spine was palpated. She has diffuse tenderness of the whole lumbar spine without localization and along the right paraspinous muscles.  Neurological: She is alert and oriented to person, place, and time. She has normal strength. No cranial nerve deficit.  Skin: Skin is warm, dry and intact. No rash noted. No erythema. No pallor.  Psychiatric: She has a normal mood and affect. Her speech is normal and behavior is normal. Her mood appears not anxious.  Nursing note and vitals reviewed.   ED Course  Procedures (including critical care time)  Medications  dexamethasone (DECADRON) injection 10 mg (10 mg Intramuscular Given 10/14/14 0643)  diazepam (VALIUM) injection 10 mg (10 mg Intramuscular Given 10/14/14 0643)   fentaNYL (SUBLIMAZE) injection 50 mcg (50 mcg Intramuscular Given 10/14/14 0643)  ondansetron (ZOFRAN-ODT) 4 MG disintegrating tablet (4 mg  Given 10/14/14 6754)     Review of the [place] shows patient gets her Adderall on a monthly basis from her PCP. In October 2015 she got #40 hydrocodone 5/325, November 24 she got #60 hydrocodone 5/325. Pt states she had back problems and was on the hydrocodone for it.   Labs Review  No results found.    Imaging Review No results found.   EKG Interpretation None      MDM   Final diagnoses:  Back muscle spasm    Discharge Medication List as of 10/14/2014  6:24 AM    START taking these medications   Details  cyclobenzaprine (FLEXERIL) 10 MG tablet Take 1 tablet (10 mg total) by mouth 3 (three) times daily as needed (muscle soreness)., Starting 10/14/2014, Until Discontinued, Print    traMADol (ULTRAM) 50 MG tablet  Take 2 tablets (100 mg total) by mouth every 6 (six) hours as needed., Starting 10/14/2014, Until Discontinued, Print        Plan discharge  Rolland Porter, MD, Alanson Aly, MD 10/14/14 [id number]

## 2015-04-11 ENCOUNTER — Emergency Department (HOSPITAL_COMMUNITY)
Admission: EM | Admit: 2015-04-11 | Discharge: 2015-04-11 | Disposition: A | Discharge status: Home / Self Care | Payer: 59 | Attending: Emergency Medicine | Admitting: Emergency Medicine

## 2015-04-11 ENCOUNTER — Encounter (HOSPITAL_COMMUNITY): Payer: Self-pay

## 2015-04-11 ENCOUNTER — Emergency Department (HOSPITAL_COMMUNITY): Payer: 59

## 2015-04-11 DIAGNOSIS — Z3202 Encounter for pregnancy test, result negative: Secondary | ICD-10-CM | POA: Diagnosis not present

## 2015-04-11 DIAGNOSIS — R0981 Nasal congestion: Secondary | ICD-10-CM | POA: Insufficient documentation

## 2015-04-11 DIAGNOSIS — M79604 Pain in right leg: Secondary | ICD-10-CM | POA: Diagnosis not present

## 2015-04-11 DIAGNOSIS — R1031 Right lower quadrant pain: Secondary | ICD-10-CM

## 2015-04-11 DIAGNOSIS — E079 Disorder of thyroid, unspecified: Secondary | ICD-10-CM | POA: Diagnosis not present

## 2015-04-11 DIAGNOSIS — Z720 Tobacco use: Secondary | ICD-10-CM | POA: Insufficient documentation

## 2015-04-11 DIAGNOSIS — R1030 Lower abdominal pain, unspecified: Secondary | ICD-10-CM | POA: Diagnosis present

## 2015-04-11 DIAGNOSIS — N760 Acute vaginitis: Secondary | ICD-10-CM | POA: Diagnosis not present

## 2015-04-11 DIAGNOSIS — R197 Diarrhea, unspecified: Secondary | ICD-10-CM | POA: Insufficient documentation

## 2015-04-11 DIAGNOSIS — Z87442 Personal history of urinary calculi: Secondary | ICD-10-CM | POA: Insufficient documentation

## 2015-04-11 DIAGNOSIS — R109 Unspecified abdominal pain: Secondary | ICD-10-CM

## 2015-04-11 DIAGNOSIS — B9689 Other specified bacterial agents as the cause of diseases classified elsewhere: Secondary | ICD-10-CM

## 2015-04-11 DIAGNOSIS — Z79899 Other long term (current) drug therapy: Secondary | ICD-10-CM | POA: Diagnosis not present

## 2015-04-11 DIAGNOSIS — R102 Pelvic and perineal pain: Secondary | ICD-10-CM

## 2015-04-11 HISTORY — DX: Disorder of kidney and ureter, unspecified: N28.9

## 2015-04-11 LAB — COMPREHENSIVE METABOLIC PANEL
ALT: 14 U/L (ref 14–54)
AST: 19 U/L (ref 15–41)
Albumin: 3.8 g/dL (ref 3.5–5.0)
Alkaline Phosphatase: 78 U/L (ref 38–126)
Anion gap: 9 (ref 5–15)
BUN: 8 mg/dL (ref 6–20)
CO2: 24 mmol/L (ref 22–32)
Calcium: 9 mg/dL (ref 8.9–10.3)
Chloride: 102 mmol/L (ref 101–111)
Creatinine, Ser: 0.58 mg/dL (ref 0.44–1.00)
GFR calc Af Amer: >60 mL/min (ref >60)
GFR calc non Af Amer: >60 mL/min (ref >60)
Glucose, Bld: 88 mg/dL (ref 65–99)
Potassium: 4.1 mmol/L (ref 3.5–5.1)
Sodium: 135 mmol/L (ref 135–145)
Total Bilirubin: 0.6 mg/dL (ref 0.3–1.2)
Total Protein: 7 g/dL (ref 6.5–8.1)

## 2015-04-11 LAB — URINALYSIS, ROUTINE W REFLEX MICROSCOPIC
Bilirubin Urine: NEGATIVE
Glucose, UA: NEGATIVE mg/dL
Hgb urine dipstick: NEGATIVE
Ketones, ur: NEGATIVE mg/dL
Nitrite: NEGATIVE
Protein, ur: NEGATIVE mg/dL
Specific Gravity, Urine: 1.02 (ref 1.005–1.030)
Urobilinogen, UA: 0.2 mg/dL (ref 0.0–1.0)
pH: 6 (ref 5.0–8.0)

## 2015-04-11 LAB — WET PREP, GENITAL
Trich, Wet Prep: NONE SEEN
Yeast Wet Prep HPF POC: NONE SEEN

## 2015-04-11 LAB — CBC WITH DIFFERENTIAL/PLATELET
Basophils Absolute: 0 10*3/uL (ref 0.0–0.1)
Basophils Relative: 0 % (ref 0–1)
Eosinophils Absolute: 0.6 10*3/uL (ref 0.0–0.7)
Eosinophils Relative: 9 % — ABNORMAL HIGH (ref 0–5)
HCT: 40.9 % (ref 36.0–46.0)
Hemoglobin: 13.9 g/dL (ref 12.0–15.0)
Lymphocytes Relative: 35 % (ref 12–46)
Lymphs Abs: 2.1 10*3/uL (ref 0.7–4.0)
MCH: 29.7 pg (ref 26.0–34.0)
MCHC: 34 g/dL (ref 30.0–36.0)
MCV: 87.4 fL (ref 78.0–100.0)
Monocytes Absolute: 0.5 10*3/uL (ref 0.1–1.0)
Monocytes Relative: 8 % (ref 3–12)
Neutro Abs: 2.9 10*3/uL (ref 1.7–7.7)
Neutrophils Relative %: 48 % (ref 43–77)
Platelets: 181 10*3/uL (ref 150–400)
RBC: 4.68 MIL/uL (ref 3.87–5.11)
RDW: 12.5 % (ref 11.5–15.5)
WBC: 6.1 10*3/uL (ref 4.0–10.5)

## 2015-04-11 LAB — URINE MICROSCOPIC-ADD ON

## 2015-04-11 LAB — POC URINE PREG, ED: Preg Test, Ur: NEGATIVE

## 2015-04-11 MED ORDER — OXYCODONE-ACETAMINOPHEN 5-325 MG PO TABS
1.0000 | ORAL_TABLET | Freq: Once | ORAL | Status: AC
  Administered 2015-04-11: 1 via ORAL
  Filled 2015-04-11: qty 1

## 2015-04-11 MED ORDER — METRONIDAZOLE 500 MG PO TABS: 500.0000 mg | ORAL_TABLET | Freq: Two times a day (BID) | ORAL | Status: DC

## 2015-04-11 MED ORDER — HYDROCODONE-ACETAMINOPHEN 5-325 MG PO TABS
1.0000 | ORAL_TABLET | Freq: Four times a day (QID) | ORAL | Status: DC | PRN
Start: 2015-04-11 — End: 2017-04-01

## 2015-04-11 MED ORDER — CYCLOBENZAPRINE HCL 5 MG PO TABS: 5.0000 mg | ORAL_TABLET | Freq: Three times a day (TID) | ORAL | Status: DC | PRN

## 2015-04-11 NOTE — Discharge Instructions (Signed)
Flank Pain Flank pain refers to pain that is located on the side of the body between the upper abdomen and the back. The pain may occur over a short period of time (acute) or may be long-term or reoccurring (chronic). It may be mild or severe. Flank pain can be caused by many things. CAUSES  Some of the more common causes of flank pain include:  Muscle strains.   Muscle spasms.   A disease of your spine (vertebral disk disease).   A lung infection (pneumonia).   Fluid around your lungs (pulmonary edema).   A kidney infection.   Kidney stones.   A very painful skin rash caused by the chickenpox virus (shingles).   Gallbladder disease.  [place] care will depend on the cause of your pain. In general,  Rest as directed by your caregiver.  Drink enough fluids to keep your urine clear or pale yellow.  Only take over-the-counter or prescription medicines as directed by your caregiver. Some medicines may help relieve the pain.  Tell your caregiver about any changes in your pain.  Follow up with your caregiver as directed. SEEK IMMEDIATE MEDICAL CARE IF:   Your pain is not controlled with medicine.   You have new or worsening symptoms.  Your pain increases.   You have abdominal pain.   You have shortness of breath.   You have persistent nausea or vomiting.   You have swelling in your abdomen.   You feel faint or pass out.   You have blood in your urine.  You have a fever or persistent symptoms for more than 2-3 days.  You have a fever and your symptoms suddenly get worse. MAKE SURE YOU:   Understand these instructions.  Will watch your condition.  Will get help right away if you are not doing well or get worse. Document Released: 10/09/2005 Document Revised: 05/12/2012 Document Reviewed: 04/01/2012 [facility] Patient Information 2015 [place], [place]. This information is not intended to replace advice given to you by your  health care provider. Make sure you discuss any questions you have with your health care provider. Bacterial Vaginosis Bacterial vaginosis is a vaginal infection that occurs when the normal balance of bacteria in the vagina is disrupted. It results from an overgrowth of certain bacteria. This is the most common vaginal infection in women of childbearing age. Treatment is important to prevent complications, especially in pregnant women, as it can cause a premature delivery. CAUSES  Bacterial vaginosis is caused by an increase in harmful bacteria that are normally present in smaller amounts in the vagina. Several different kinds of bacteria can cause bacterial vaginosis. However, the reason that the condition develops is not fully understood. RISK FACTORS Certain activities or behaviors can put you at an increased risk of developing bacterial vaginosis, including:  Having a new sex partner or multiple sex partners.  Douching.  Using an intrauterine device (IUD) for contraception. Women do not get bacterial vaginosis from toilet seats, bedding, swimming pools, or contact with objects around them. SIGNS AND SYMPTOMS  Some women with bacterial vaginosis have no signs or symptoms. Common symptoms include:  Grey vaginal discharge.  A fishlike odor with discharge, especially after sexual intercourse.  Itching or burning of the vagina and vulva.  Burning or pain with urination. DIAGNOSIS  Your health care provider will take a medical history and examine the vagina for signs of bacterial vaginosis. A sample of vaginal fluid may be taken. Your health care provider will  look at this sample under a microscope to check for bacteria and abnormal cells. A vaginal pH test may also be done.  TREATMENT  Bacterial vaginosis may be treated with antibiotic medicines. These may be given in the form of a pill or a vaginal cream. A second round of antibiotics may be prescribed if the condition comes back after  treatment.  HOME CARE INSTRUCTIONS   Only take over-the-counter or prescription medicines as directed by your health care provider.  If antibiotic medicine was prescribed, take it as directed. Make sure you finish it even if you start to feel better.  Do not have sex until treatment is completed.  Tell all sexual partners that you have a vaginal infection. They should see their health care provider and be treated if they have problems, such as a mild rash or itching.  Practice safe sex by using condoms and only having one sex partner. SEEK MEDICAL CARE IF:   Your symptoms are not improving after 3 days of treatment.  You have increased discharge or pain.  You have a fever. MAKE SURE YOU:   Understand these instructions.  Will watch your condition.  Will get help right away if you are not doing well or get worse. FOR MORE INFORMATION  Centers for Disease Control and Prevention, Division of STD Prevention: [employer]raud[employer] American Sexual Health Association (ASHA): www.ashastd.org  Document Released: 08/18/2005 Document Revised: 06/08/2013 Document Reviewed: 03/30/2013 [facility] Patient Information 2015 [place], [place]. This information is not intended to replace advice given to you by your health care provider. Make sure you discuss any questions you have with your health care provider.

## 2015-04-11 NOTE — ED Provider Notes (Signed)
CSN: [id number]   Arrival date & time 04/11/15 8841  History  This chart was scribed for  Quintella Reichert, MD by Altamease Oiler, ED Scribe. This patient was seen in room APA01/APA01 and the patient's care was started at 9:41 AM.  Chief Complaint  Patient presents with  . Flank Pain    HPI The history is provided by the patient. No language interpreter was used.   Kelsey Donovan is a 24 y.o. female with PMHx of kidney stones who presents to the Emergency Department complaining of constant right flank pain with onset 3 days ago. She describes the pain as sharp. Applying pressure to the right side improves the pain. The pain radiates to the right side of the pelvis. Associated symptoms include blood and mucous noted after wiping since yesterday (she is unsure if the blood comes from her urine or her vagina) and lower abdominal pressure that is improved after urination. Her last kidney stone was 5 months ago. She did not require surgery but was catheterized at that time. Pt states that she does not believe that her symptoms are due to a kidney stone because she usually is unable to urinate with kidney stones. Also complains of nasal congestion, diarrhea, and right leg pain. Pt denies dysuria and fever. No recent antibiotic use. No new sexual partners. She has no urologist. Last CT was 5 months ago. Last pelvic US was 2 years ago.   Past surgical history is significant for gastric bypass.  Past Medical History  Diagnosis Date  . Thyroid disease   . Renal disorder     kidney stones    Past Surgical History  Procedure Laterality Date  . Tonsillectomy    . Gastric bybpass      No family history on file.  Social History  Substance Use Topics  . Smoking status: Current Every Day Smoker  . Smokeless tobacco: None  . Alcohol Use: No     Review of Systems  Constitutional: Negative for fever.  HENT: Positive for congestion.   Gastrointestinal: Positive for abdominal pain and diarrhea.   Genitourinary: Positive for flank pain. Negative for dysuria and difficulty urinating.       Blood and mucous noted after wiping  Musculoskeletal:       Right leg pain  All other systems reviewed and are negative.    Home Medications   Prior to Admission medications   Medication Sig Start Date End Date Taking? Authorizing Provider  acetaminophen (TYLENOL) 500 MG tablet Take 1,000 mg by mouth every 6 (six) hours as needed for moderate pain.    Historical Provider, MD  amphetamine-dextroamphetamine (ADDERALL) 30 MG tablet Take 30 mg by mouth 2 (two) times daily.    Historical Provider, MD  azithromycin (ZITHROMAX) 250 MG tablet Take 1 tablet (250 mg total) by mouth daily. Take first 2 tablets together, then 1 every day until finished. 06/23/14   Tammy Triplett, PA-C  cyclobenzaprine (FLEXERIL) 10 MG tablet Take 1 tablet (10 mg total) by mouth 3 (three) times daily as needed (muscle soreness). 10/14/14   Rolland Porter, MD  DULoxetine (CYMBALTA) 20 MG capsule Take 20 mg by mouth daily.    Historical Provider, MD  folic acid (FOLVITE) 1 MG tablet Take 1 tablet by mouth daily. 12/21/13   Historical Provider, MD  guaiFENesin-codeine (ROBITUSSIN AC) 100-10 MG/5ML syrup Take 10 mLs by mouth 3 (three) times daily as needed. 06/23/14   Tammy Triplett, PA-C  HYDROcodone-acetaminophen (NORCO/VICODIN) 5-325 MG per tablet Take 1  tablet by mouth every 4 (four) hours as needed (pain).    Historical Provider, MD  levothyroxine (SYNTHROID, LEVOTHROID) 125 MCG tablet Take 125 mcg by mouth daily before breakfast.    Historical Provider, MD  Multiple Vitamin (MULTIVITAMIN WITH MINERALS) TABS tablet Take 2 tablets by mouth daily.    Historical Provider, MD  predniSONE (DELTASONE) 20 MG tablet Take 3 po QD x 3d , then 2 po QD x 3d then 1 po QD x 3d 10/14/14   Rolland Porter, MD  traMADol (ULTRAM) 50 MG tablet Take 2 tablets (100 mg total) by mouth every 6 (six) hours as needed. 10/14/14   Rolland Porter, MD  Vitamin D,  Ergocalciferol, (DRISDOL) 50000 UNITS CAPS capsule Take 50,000 Units by mouth every 7 (seven) days. On Wednesday.    Historical Provider, MD    Allergies  Ibuprofen and Nsaids  Triage Vitals: BP 115/67 mmHg  Pulse 71  Temp(Src) 98.4 F (36.9 C)  Resp 20  Ht 5\' 3"  (1.6 m)  Wt 136 lb (61.689 kg)  BMI 24.10 kg/m2  SpO2 100%  LMP 02/28/2015  Physical Exam  Constitutional: She is oriented to person, place, and time. She appears well-developed and well-nourished.  HENT:  Head: Normocephalic and atraumatic.  Cardiovascular: Normal rate and regular rhythm.   No murmur heard. Pulmonary/Chest: Effort normal and breath sounds normal. No respiratory distress.  Abdominal: Soft. There is tenderness. There is CVA tenderness. There is no rebound and no guarding.  Moderate right CVA tenderness Mild suprapubic/RLQ tenderness  Genitourinary:  No CMT, significant right adnexal tenderness, no significant vaginal discharge.  Os closed  Musculoskeletal: She exhibits no edema or tenderness.  Neurological: She is alert and oriented to person, place, and time.  5 out of 5 strength in bilateral lower extremities  Skin: Skin is warm and dry.  Psychiatric: She has a normal mood and affect. Her behavior is normal.  Nursing note and vitals reviewed.   ED Course  Procedures   DIAGNOSTIC STUDIES: Oxygen Saturation is 100% on RA, normal by my interpretation.    COORDINATION OF CARE: 9:52 AM Discussed treatment plan which includes lab work and Percocet/Roxicet with pt at bedside and pt agreed to plan.  Labs Review-  Labs Reviewed  WET PREP, GENITAL - Abnormal; Notable for the following:    Clue Cells Wet Prep HPF POC MANY (*)    WBC, Wet Prep HPF POC MANY (*)    All other components within normal limits  URINALYSIS, ROUTINE W REFLEX MICROSCOPIC (NOT AT [facility]) - Abnormal; Notable for the following:    APPearance HAZY (*)    Leukocytes, UA SMALL (*)    All other components within normal limits   CBC WITH DIFFERENTIAL/PLATELET - Abnormal; Notable for the following:    Eosinophils Relative 9 (*)    All other components within normal limits  URINE MICROSCOPIC-ADD ON - Abnormal; Notable for the following:    Squamous Epithelial / LPF MANY (*)    Bacteria, UA MANY (*)    All other components within normal limits  URINE CULTURE  COMPREHENSIVE METABOLIC PANEL  POC URINE PREG, ED  GC/CHLAMYDIA PROBE AMP ([place]) NOT AT [facility]    Imaging Review US Transvaginal Non-ob  04/11/2015   CLINICAL DATA:  Right back and right lower quadrant abdominal pain for 3 days. History of ovarian cyst, kidney stones and menorrhagia.  EXAM: TRANSABDOMINAL AND TRANSVAGINAL ULTRASOUND OF PELVIS  TECHNIQUE: Both transabdominal and transvaginal ultrasound examinations of the pelvis were performed.  Transabdominal technique was performed for global imaging of the pelvis including uterus, ovaries, adnexal regions, and pelvic cul-de-sac. It was necessary to proceed with endovaginal exam following the transabdominal exam to visualize the endometrium and ovaries to better advantage.  COMPARISON:  Pelvic CT 03/13/2014.  FINDINGS: Uterus  Measurements: 7.5 x 3.6 x 5.5 cm. No fibroids or other mass visualized. Small nabothian cysts noted.  Endometrium  Thickness: 5.5 mm.  No focal abnormality visualized.  Right ovary  Measurements: 3.5 x 1.9 x 3.6 cm. There is a mildly complex cyst measuring 2.3 x 1.1 x 1.3 cm. Normal blood flow with color Doppler.  Left ovary  Measurements: 3.7 x 2.0 x 3.6 cm. There is a septated cyst measuring 2.4 x 1.7 x 2.4 cm. Blood flow with color Doppler.  Other findings  No free fluid.  IMPRESSION: 1. No acute findings or explanation for the patient's symptoms. 2. Small mildly complex ovarian cysts or follicles bilaterally. 3. The uterus and endometrium demonstrate no significant findings.   Electronically Signed   By: Richardean Sale M.D.   On: 04/11/2015 13:10   US Pelvis Complete  04/11/2015    CLINICAL DATA:  Right back and right lower quadrant abdominal pain for 3 days. History of ovarian cyst, kidney stones and menorrhagia.  EXAM: TRANSABDOMINAL AND TRANSVAGINAL ULTRASOUND OF PELVIS  TECHNIQUE: Both transabdominal and transvaginal ultrasound examinations of the pelvis were performed. Transabdominal technique was performed for global imaging of the pelvis including uterus, ovaries, adnexal regions, and pelvic cul-de-sac. It was necessary to proceed with endovaginal exam following the transabdominal exam to visualize the endometrium and ovaries to better advantage.  COMPARISON:  Pelvic CT 03/13/2014.  FINDINGS: Uterus  Measurements: 7.5 x 3.6 x 5.5 cm. No fibroids or other mass visualized. Small nabothian cysts noted.  Endometrium  Thickness: 5.5 mm.  No focal abnormality visualized.  Right ovary  Measurements: 3.5 x 1.9 x 3.6 cm. There is a mildly complex cyst measuring 2.3 x 1.1 x 1.3 cm. Normal blood flow with color Doppler.  Left ovary  Measurements: 3.7 x 2.0 x 3.6 cm. There is a septated cyst measuring 2.4 x 1.7 x 2.4 cm. Blood flow with color Doppler.  Other findings  No free fluid.  IMPRESSION: 1. No acute findings or explanation for the patient's symptoms. 2. Small mildly complex ovarian cysts or follicles bilaterally. 3. The uterus and endometrium demonstrate no significant findings.   Electronically Signed   By: Richardean Sale M.D.   On: 04/11/2015 13:10    EKG Interpretation None      MDM   Final diagnoses:  Pelvic pain in female  BV (bacterial vaginosis)    Patient here for right flank pain, right lower quadrant pain. Pelvic exam with some adnexal tenderness. Examination is not consistent with PID. Pelvic ultrasound without no acute out of maladies. UA is a skin contaminant. History of presentation is not consistent with obstructing stone, cauda equina, epidural abscess. Discussed with patient and care for muscular back pain, flank pain, bacterial vaginosis. Discussed  homecare return precautions.  I personally performed the services described in this documentation, which was scribed in my presence. The recorded information has been reviewed and is accurate.     Quintella Reichert, MD 04/11/15 [phone]

## 2015-04-11 NOTE — ED Notes (Signed)
Pt c/o r sided flank pain radiating around to lower abd x 3 days.  Reports pressure in abd when urinates and has noticed some bloody and mucousy discharge.

## 2015-04-12 LAB — GC/CHLAMYDIA PROBE AMP (~~LOC~~) NOT AT ARMC
Chlamydia: NEGATIVE
Neisseria Gonorrhea: NEGATIVE

## 2015-07-17 ENCOUNTER — Encounter (HOSPITAL_COMMUNITY): Payer: Self-pay | Admitting: Emergency Medicine

## 2015-07-17 ENCOUNTER — Emergency Department (HOSPITAL_COMMUNITY)
Admission: EM | Admit: 2015-07-17 | Discharge: 2015-07-18 | Disposition: A | Discharge status: Home / Self Care | Payer: 59 | Attending: Emergency Medicine | Admitting: Emergency Medicine

## 2015-07-17 DIAGNOSIS — F17200 Nicotine dependence, unspecified, uncomplicated: Secondary | ICD-10-CM | POA: Diagnosis not present

## 2015-07-17 DIAGNOSIS — R531 Weakness: Secondary | ICD-10-CM | POA: Insufficient documentation

## 2015-07-17 DIAGNOSIS — Z792 Long term (current) use of antibiotics: Secondary | ICD-10-CM | POA: Diagnosis not present

## 2015-07-17 DIAGNOSIS — Z79899 Other long term (current) drug therapy: Secondary | ICD-10-CM | POA: Insufficient documentation

## 2015-07-17 DIAGNOSIS — Z86018 Personal history of other benign neoplasm: Secondary | ICD-10-CM | POA: Diagnosis not present

## 2015-07-17 DIAGNOSIS — Z9884 Bariatric surgery status: Secondary | ICD-10-CM | POA: Diagnosis not present

## 2015-07-17 DIAGNOSIS — Z87442 Personal history of urinary calculi: Secondary | ICD-10-CM | POA: Diagnosis not present

## 2015-07-17 DIAGNOSIS — E079 Disorder of thyroid, unspecified: Secondary | ICD-10-CM | POA: Insufficient documentation

## 2015-07-17 DIAGNOSIS — M545 Low back pain, unspecified: Secondary | ICD-10-CM

## 2015-07-17 DIAGNOSIS — Z3202 Encounter for pregnancy test, result negative: Secondary | ICD-10-CM | POA: Insufficient documentation

## 2015-07-17 DIAGNOSIS — N939 Abnormal uterine and vaginal bleeding, unspecified: Secondary | ICD-10-CM | POA: Diagnosis present

## 2015-07-17 DIAGNOSIS — N938 Other specified abnormal uterine and vaginal bleeding: Secondary | ICD-10-CM | POA: Diagnosis not present

## 2015-07-17 NOTE — ED Notes (Signed)
Pt c/o lower back pain, heavy vaginal bleeding and lower abd pain.

## 2015-07-18 LAB — CBC WITH DIFFERENTIAL/PLATELET
Basophils Absolute: 0 10*3/uL (ref 0.0–0.1)
Basophils Relative: 0 %
Eosinophils Absolute: 1.2 10*3/uL — ABNORMAL HIGH (ref 0.0–0.7)
Eosinophils Relative: 16 %
HCT: 38.8 % (ref 36.0–46.0)
Hemoglobin: 12.7 g/dL (ref 12.0–15.0)
Lymphocytes Relative: 43 %
Lymphs Abs: 3.1 10*3/uL (ref 0.7–4.0)
MCH: 28.2 pg (ref 26.0–34.0)
MCHC: 32.7 g/dL (ref 30.0–36.0)
MCV: 86.2 fL (ref 78.0–100.0)
Monocytes Absolute: 0.6 10*3/uL (ref 0.1–1.0)
Monocytes Relative: 8 %
Neutro Abs: 2.4 10*3/uL (ref 1.7–7.7)
Neutrophils Relative %: 33 %
Platelets: 231 10*3/uL (ref 150–400)
RBC: 4.5 MIL/uL (ref 3.87–5.11)
RDW: 13 % (ref 11.5–15.5)
WBC: 7.3 10*3/uL (ref 4.0–10.5)

## 2015-07-18 LAB — WET PREP, GENITAL
Clue Cells Wet Prep HPF POC: NONE SEEN
Sperm: NONE SEEN
Trich, Wet Prep: NONE SEEN
WBC, Wet Prep HPF POC: NONE SEEN
Yeast Wet Prep HPF POC: NONE SEEN

## 2015-07-18 LAB — BASIC METABOLIC PANEL
Anion gap: 5 (ref 5–15)
BUN: 11 mg/dL (ref 6–20)
CO2: 24 mmol/L (ref 22–32)
Calcium: 8.6 mg/dL — ABNORMAL LOW (ref 8.9–10.3)
Chloride: 107 mmol/L (ref 101–111)
Creatinine, Ser: 0.46 mg/dL (ref 0.44–1.00)
GFR calc Af Amer: >60 mL/min (ref >60)
GFR calc non Af Amer: >60 mL/min (ref >60)
Glucose, Bld: 90 mg/dL (ref 65–99)
Potassium: 3.9 mmol/L (ref 3.5–5.1)
Sodium: 136 mmol/L (ref 135–145)

## 2015-07-18 LAB — URINALYSIS, ROUTINE W REFLEX MICROSCOPIC
Bilirubin Urine: NEGATIVE
Glucose, UA: NEGATIVE mg/dL
Ketones, ur: NEGATIVE mg/dL
Leukocytes, UA: NEGATIVE
Nitrite: NEGATIVE
Protein, ur: NEGATIVE mg/dL
Specific Gravity, Urine: 1.015 (ref 1.005–1.030)
pH: 6 (ref 5.0–8.0)

## 2015-07-18 LAB — URINE MICROSCOPIC-ADD ON

## 2015-07-18 LAB — I-STAT BETA HCG BLOOD, ED (MC, WL, AP ONLY): I-stat hCG, quantitative: <5 m[IU]/mL (ref <5)

## 2015-07-18 LAB — PREGNANCY, URINE: Preg Test, Ur: NEGATIVE

## 2015-07-18 MED ORDER — KETOROLAC TROMETHAMINE 60 MG/2ML IM SOLN
INTRAMUSCULAR | Status: AC
  Filled 2015-07-18: qty 2

## 2015-07-18 MED ORDER — ACETAMINOPHEN 325 MG PO TABS
650.0000 mg | ORAL_TABLET | Freq: Once | ORAL | Status: AC
  Administered 2015-07-18: 650 mg via ORAL

## 2015-07-18 MED ORDER — ACETAMINOPHEN 325 MG PO TABS
ORAL_TABLET | ORAL | Status: AC
  Filled 2015-07-18: qty 2

## 2015-07-18 MED ORDER — KETOROLAC TROMETHAMINE 60 MG/2ML IM SOLN: 60.0000 mg | Freq: Once | INTRAMUSCULAR | Status: DC

## 2015-07-18 NOTE — ED Provider Notes (Signed)
CSN: [id number]     Arrival date & time 07/17/15  2253 History  By signing my name below, I, Meriel Pica, attest that this documentation has been prepared under the direction and in the presence of Rolland Porter, MD at [facility]. Electronically Signed: Meriel Pica, ED Scribe. 07/18/2015. 12:42 AM.   Chief Complaint  Patient presents with  . Back Pain   The history is provided by the patient. No language interpreter was used.   HPI Comments: Kelsey Donovan is a 24 y.o. female, with a PMhx of renal calculi and uterine fibroids, who presents to the Emergency Department complaining of vaginal bleeding that is heavier than her normal menses onset 9 days ago. She associates generalized weakness and reports she has dropped her cup twice today due to weakness in her hands. Pt reports a heavier than normal menses in October that lasted for 7 days (Oct 22-Nov 3) with her last normal menses being in September. She reports onset of vaginal bleeding again 9 days (Nov 7) ago that has persisted and been heavier than normal. Denies nausea and vomiting. She has a Implanon implant with scheduled removal in 2017. G:2 P:2.AB 0. She also c/o lower back pain with right-sided flank pain that radiates to RLQ X 3-4 months. This pain is exacerbated with standing and sitting for long periods of time. She describes this pain to be a constant pressure pain. Pt reports her PCP who has ordered an MRI and Xray of her back, but it hasn't been done yet.  She is a current daily smoker. Pt works as a Tree surgeon at [employer]. PShx of gastric bypass.   PCP [facility] [place] [facility]  Past Medical History  Diagnosis Date  . Thyroid disease   . Renal disorder     kidney stones   Past Surgical History  Procedure Laterality Date  . Tonsillectomy    . Gastric bybpass     History reviewed. No pertinent family history. Social History  Substance Use Topics  . Smoking status: Current Every Day Smoker   . Smokeless tobacco: None  . Alcohol Use: No   employed  OB History    No data available     Review of Systems  Constitutional: Negative for fever.  Gastrointestinal: Positive for abdominal pain. Negative for nausea and vomiting.  Genitourinary: Positive for flank pain and vaginal bleeding.  Musculoskeletal: Positive for back pain.  Neurological: Positive for weakness.  All other systems reviewed and are negative.  Allergies  Ibuprofen and Nsaids  B/O gastric bypass, not an allergy  Home Medications   Prior to Admission medications   Medication Sig Start Date End Date Taking? Authorizing Provider  acetaminophen (TYLENOL) 500 MG tablet Take 1,000 mg by mouth every 6 (six) hours as needed for moderate pain.   Yes Historical Provider, MD  amphetamine-dextroamphetamine (ADDERALL) 30 MG tablet Take 30 mg by mouth 2 (two) times daily.   Yes Historical Provider, MD  budesonide-formoterol (SYMBICORT) 80-4.5 MCG/ACT inhaler Inhale 2 puffs into the lungs 2 (two) times daily as needed (shortness of breath).   Yes Historical Provider, MD  ferrous gluconate (FERGON) 324 MG tablet Take 324 mg by mouth daily with breakfast.   Yes Historical Provider, MD  levothyroxine (SYNTHROID, LEVOTHROID) 125 MCG tablet Take 125 mcg by mouth daily before breakfast.   Yes Historical Provider, MD  Multiple Vitamin (MULTIVITAMIN WITH MINERALS) TABS tablet Take 2 tablets by mouth daily.   Yes Historical Provider, MD  vitamin B-12 (CYANOCOBALAMIN) 1000 MCG tablet Take 1,000 mcg by mouth daily.   Yes Historical Provider, MD  Vitamin D, Ergocalciferol, (DRISDOL) 50000 UNITS CAPS capsule Take 50,000 Units by mouth every 7 (seven) days. On Wednesday.   Yes Historical Provider, MD  cyclobenzaprine (FLEXERIL) 5 MG tablet Take 1 tablet (5 mg total) by mouth 3 (three) times daily as needed for muscle spasms. 04/11/15   Quintella Reichert, MD  DULoxetine (CYMBALTA) 20 MG capsule Take 20 mg by mouth daily.    Historical Provider,  MD  HYDROcodone-acetaminophen (NORCO/VICODIN) 5-325 MG per tablet Take 1 tablet by mouth every 6 (six) hours as needed. 04/11/15   Quintella Reichert, MD  metroNIDAZOLE (FLAGYL) 500 MG tablet Take 1 tablet (500 mg total) by mouth 2 (two) times daily. 04/11/15   Quintella Reichert, MD   BP 103/59 mmHg  Pulse 83  Temp(Src) 97.7 F (36.5 C)  Resp 18  Ht 5\' 2"  (1.575 m)  Wt 134 lb (60.782 kg)  BMI 24.50 kg/m2  SpO2 100%  LMP 07/09/2015  Vital signs normal   Physical Exam  Constitutional: She is oriented to person, place, and time. She appears well-developed and well-nourished.  Non-toxic appearance. She does not appear ill. No distress.  HENT:  Head: Normocephalic and atraumatic.  Right Ear: External ear normal.  Left Ear: External ear normal.  Nose: Nose normal. No mucosal edema or rhinorrhea.  Mouth/Throat: Oropharynx is clear and moist and mucous membranes are normal. No dental abscesses or uvula swelling.  Eyes: Conjunctivae and EOM are normal. Pupils are equal, round, and reactive to light.  Neck: Normal range of motion and full passive range of motion without pain. Neck supple.  Cardiovascular: Normal rate, regular rhythm and normal heart sounds.  Exam reveals no gallop and no friction rub.   No murmur heard. Pulmonary/Chest: Effort normal and breath sounds normal. No respiratory distress. She has no wheezes. She has no rhonchi. She has no rales. She exhibits no tenderness and no crepitus.  Abdominal: Soft. Normal appearance and bowel sounds are normal. She exhibits no distension. There is no tenderness. There is CVA tenderness. There is no rebound and no guarding.  Genitourinary:  Patient has normal external genitalia. She has a small amount of dark blood in her vault. She has tenderness to palpation over her uterus which feels normal sized, she has some mild tenderness over her right ovary without masses. Her left adnexa is nontender.  Musculoskeletal: Normal range of motion. She exhibits  tenderness. She exhibits no edema.  Moves all extremities well. Tender diffusely to lower thoracic spine, entire lumbar spine, and over right flank.   Neurological: She is alert and oriented to person, place, and time. She has normal strength. No cranial nerve deficit.  Skin: Skin is warm, dry and intact. No rash noted. No erythema. No pallor.  Psychiatric: She has a normal mood and affect. Her speech is normal and behavior is normal. Her mood appears not anxious.  Nursing note and vitals reviewed.   ED Course  Procedures   Medications  ketorolac (TORADOL) injection 60 mg (60 mg Intramuscular Not Given 07/18/15 0059)  acetaminophen (TYLENOL) tablet 650 mg (650 mg Oral Given 07/18/15 0121)    DIAGNOSTIC STUDIES: Oxygen Saturation is 100% on RA, normal by my interpretation.    COORDINATION OF CARE: 12:41 AM Discussed treatment plan with pt at bedside and pt agreed to plan. Toradol ordered for pain. Will perform pelvic exam and order diagnostic labs.   Patient refused Toradol  because of her bypass surgery. She was given Tylenol for pain. When I went back to do her pelvic exam patient was sleeping in no distress. We discussed follow-up with the GYN either at the [facility] or [facility] for her dysfunctional uterine bleeding. She already has discussed this right-sided back pain with her PCP who is are any discussed ordering testing on her including MRI and x-rays. She is advised to follow-up with them to get that done.   Labs Review Results for orders placed or performed during the hospital encounter of 07/17/15  Wet prep, genital  Result Value Ref Range   Yeast Wet Prep HPF POC NONE SEEN NONE SEEN   Trich, Wet Prep NONE SEEN NONE SEEN   Clue Cells Wet Prep HPF POC NONE SEEN NONE SEEN   WBC, Wet Prep HPF POC NONE SEEN NONE SEEN   Sperm NONE SEEN   Urinalysis, Routine w reflex microscopic (not at [facility])  Result Value Ref Range   Color, Urine YELLOW YELLOW   APPearance  CLEAR CLEAR   Specific Gravity, Urine 1.015 1.005 - 1.030   pH 6.0 5.0 - 8.0   Glucose, UA NEGATIVE NEGATIVE mg/dL   Hgb urine dipstick LARGE (A) NEGATIVE   Bilirubin Urine NEGATIVE NEGATIVE   Ketones, ur NEGATIVE NEGATIVE mg/dL   Protein, ur NEGATIVE NEGATIVE mg/dL   Nitrite NEGATIVE NEGATIVE   Leukocytes, UA NEGATIVE NEGATIVE  Pregnancy, urine  Result Value Ref Range   Preg Test, Ur NEGATIVE NEGATIVE  Basic metabolic panel  Result Value Ref Range   Sodium 136 135 - 145 mmol/L   Potassium 3.9 3.5 - 5.1 mmol/L   Chloride 107 101 - 111 mmol/L   CO2 24 22 - 32 mmol/L   Glucose, Bld 90 65 - 99 mg/dL   BUN 11 6 - 20 mg/dL   Creatinine, Ser 0.46 0.44 - 1.00 mg/dL   Calcium 8.6 (L) 8.9 - 10.3 mg/dL   GFR calc non Af Amer >60 >60 mL/min   GFR calc Af Amer >60 >60 mL/min   Anion gap 5 5 - 15  CBC with Differential  Result Value Ref Range   WBC 7.3 4.0 - 10.5 K/uL   RBC 4.50 3.87 - 5.11 MIL/uL   Hemoglobin 12.7 12.0 - 15.0 g/dL   HCT 38.8 36.0 - 46.0 %   MCV 86.2 78.0 - 100.0 fL   MCH 28.2 26.0 - 34.0 pg   MCHC 32.7 30.0 - 36.0 g/dL   RDW 13.0 11.5 - 15.5 %   Platelets 231 150 - 400 K/uL   Neutrophils Relative % 33 %   Neutro Abs 2.4 1.7 - 7.7 K/uL   Lymphocytes Relative 43 %   Lymphs Abs 3.1 0.7 - 4.0 K/uL   Monocytes Relative 8 %   Monocytes Absolute 0.6 0.1 - 1.0 K/uL   Eosinophils Relative 16 %   Eosinophils Absolute 1.2 (H) 0.0 - 0.7 K/uL   Basophils Relative 0 %   Basophils Absolute 0.0 0.0 - 0.1 K/uL  Urine microscopic-add on  Result Value Ref Range   Squamous Epithelial / LPF 0-5 (A) NONE SEEN   WBC, UA 0-5 0 - 5 WBC/hpf   RBC / HPF 0-5 0 - 5 RBC/hpf   Bacteria, UA FEW (A) NONE SEEN  I-Stat beta hCG blood, ED  Result Value Ref Range   I-stat hCG, quantitative <5.0 <5 mIU/mL   Comment 3           Laboratory interpretation  all normal   I have personally reviewed and evaluated these lab results as part of my medical decision-making.   MDM   Final  diagnoses:  DUB (dysfunctional uterine bleeding)  Back pain, lumbosacral   Plan discharge  Rolland Porter, MD, FACEP    I personally performed the services described in this documentation, which was scribed in my presence. The recorded information has been reviewed and considered.  Rolland Porter, MD, Barbette Or, MD 07/18/15 [phone]

## 2015-07-18 NOTE — Discharge Instructions (Signed)
You can take acetaminophen 650 - 1000 mg 4 times a day for pain. Follow up with your doctor to get the studies you discussed to evaluate your back pain. You can be seen either at your health department or [facility] about your abnormal vaginal bleeding.    Abnormal Uterine Bleeding Abnormal uterine bleeding means bleeding from the vagina that is not your normal menstrual period. This can be:  Bleeding or spotting between periods.  Bleeding after sex (sexual intercourse).  Bleeding that is heavier or more than normal.  Periods that last longer than usual.  Bleeding after menopause. There are many problems that may cause this. Treatment will depend on the cause of the bleeding. Any kind of bleeding that is not normal should be reviewed by your doctor.  HOME CARE Watch your condition for any changes. These actions may lessen any discomfort you are having:  Do not use tampons or douches as told by your doctor.  Change your pads often. You should get regular pelvic exams and Pap tests. Keep all appointments for tests as told by your doctor. GET HELP IF:  You are bleeding for more than 1 week.  You feel dizzy at times. GET HELP RIGHT AWAY IF:   You pass out.  You have to change pads every 15 to 30 minutes.  You have belly pain.  You have a fever.  You become sweaty or weak.  You are passing large blood clots from the vagina.  You feel sick to your stomach (nauseous) and throw up (vomit). MAKE SURE YOU:  Understand these instructions.  Will watch your condition.  Will get help right away if you are not doing well or get worse.   This information is not intended to replace advice given to you by your health care provider. Make sure you discuss any questions you have with your health care provider.   Document Released: 06/15/2009 Document Revised: 08/23/2013 Document Reviewed: 03/17/2013 Elsevier Interactive Patient Education [employer].

## 2015-07-19 LAB — RPR: RPR Ser Ql: NONREACTIVE

## 2015-07-19 LAB — GC/CHLAMYDIA PROBE AMP (~~LOC~~) NOT AT ARMC
Chlamydia: NEGATIVE
Neisseria Gonorrhea: NEGATIVE

## 2015-07-19 LAB — HIV ANTIBODY (ROUTINE TESTING W REFLEX): HIV Screen 4th Generation wRfx: NONREACTIVE

## 2015-08-20 ENCOUNTER — Encounter: Payer: Self-pay | Admitting: Emergency Medicine

## 2015-08-20 ENCOUNTER — Emergency Department
Admission: EM | Admit: 2015-08-20 | Discharge: 2015-08-20 | Disposition: A | Discharge status: Home / Self Care | Payer: 59 | Attending: Emergency Medicine | Admitting: Emergency Medicine

## 2015-08-20 DIAGNOSIS — F17210 Nicotine dependence, cigarettes, uncomplicated: Secondary | ICD-10-CM | POA: Diagnosis not present

## 2015-08-20 DIAGNOSIS — Y998 Other external cause status: Secondary | ICD-10-CM | POA: Diagnosis not present

## 2015-08-20 DIAGNOSIS — Y9389 Activity, other specified: Secondary | ICD-10-CM | POA: Insufficient documentation

## 2015-08-20 DIAGNOSIS — S39012A Strain of muscle, fascia and tendon of lower back, initial encounter: Secondary | ICD-10-CM

## 2015-08-20 DIAGNOSIS — Z792 Long term (current) use of antibiotics: Secondary | ICD-10-CM | POA: Insufficient documentation

## 2015-08-20 DIAGNOSIS — X500XXA Overexertion from strenuous movement or load, initial encounter: Secondary | ICD-10-CM | POA: Insufficient documentation

## 2015-08-20 DIAGNOSIS — E039 Hypothyroidism, unspecified: Secondary | ICD-10-CM | POA: Insufficient documentation

## 2015-08-20 DIAGNOSIS — M62830 Muscle spasm of back: Secondary | ICD-10-CM | POA: Insufficient documentation

## 2015-08-20 DIAGNOSIS — J029 Acute pharyngitis, unspecified: Secondary | ICD-10-CM | POA: Diagnosis not present

## 2015-08-20 DIAGNOSIS — Y9289 Other specified places as the place of occurrence of the external cause: Secondary | ICD-10-CM | POA: Insufficient documentation

## 2015-08-20 DIAGNOSIS — Z79899 Other long term (current) drug therapy: Secondary | ICD-10-CM | POA: Insufficient documentation

## 2015-08-20 LAB — POCT RAPID STREP A: Streptococcus, Group A Screen (Direct): NEGATIVE

## 2015-08-20 MED ORDER — LIDOCAINE VISCOUS 2 % MT SOLN
15.0000 mL | Freq: Once | OROMUCOSAL | Status: AC
  Administered 2015-08-20: 15 mL via OROMUCOSAL
  Filled 2015-08-20: qty 15

## 2015-08-20 MED ORDER — LIDOCAINE VISCOUS 2 % MT SOLN: 5.0000 mL | Freq: Four times a day (QID) | OROMUCOSAL | Status: DC | PRN

## 2015-08-20 MED ORDER — PSEUDOEPH-BROMPHEN-DM 30-2-10 MG/5ML PO SYRP: 5.0000 mL | ORAL_SOLUTION | Freq: Four times a day (QID) | ORAL | Status: DC | PRN

## 2015-08-20 MED ORDER — CYCLOBENZAPRINE HCL 10 MG PO TABS: 10.0000 mg | ORAL_TABLET | Freq: Three times a day (TID) | ORAL | Status: DC | PRN

## 2015-08-20 MED ORDER — DIPHENHYDRAMINE HCL 12.5 MG/5ML PO ELIX
25.0000 mg | ORAL_SOLUTION | Freq: Once | ORAL | Status: AC
  Administered 2015-08-20: 25 mg via ORAL
  Filled 2015-08-20: qty 10

## 2015-08-20 MED ORDER — CYCLOBENZAPRINE HCL 10 MG PO TABS
10.0000 mg | ORAL_TABLET | Freq: Once | ORAL | Status: AC
  Administered 2015-08-20: 10 mg via ORAL
  Filled 2015-08-20: qty 1

## 2015-08-20 NOTE — ED Notes (Signed)
Assessed per PA 

## 2015-08-20 NOTE — ED Provider Notes (Signed)
[facility] Emergency Department Provider Note  ____________________________________________  Time seen: Approximately 7:39 PM  I have reviewed the triage vital signs and the nursing notes.   HISTORY  Chief Complaint Sore Throat    HPI Kelsey Donovan is a 24 y.o. female complain of sore throat upon a.m. awakening. Patient denies any URI signs and symptoms. Patient states she's had some difficulty swallowing but able to tolerate food and fluids. Patient rates her pain discomfort as a 9/10. No palliative measures taken for this complaint. Patient also complaining of  low back pain  secondary to  repetitive lifting. Patient denies any radicular component to her pain. She denies any bladder or bowel dysfunction. Patient is rating her back pain as a 7/10.   Past Medical History  Diagnosis Date  . Thyroid disease   . Renal disorder     kidney stones    There are no active problems to display for this patient.   Past Surgical History  Procedure Laterality Date  . Tonsillectomy    . Gastric bybpass      Current Outpatient Rx  Name  Route  Sig  Dispense  Refill  . acetaminophen (TYLENOL) 500 MG tablet   Oral   Take 1,000 mg by mouth every 6 (six) hours as needed for moderate pain.         Marland Kitchen amphetamine-dextroamphetamine (ADDERALL) 30 MG tablet   Oral   Take 30 mg by mouth 2 (two) times daily.         . brompheniramine-pseudoephedrine-DM 30-2-10 MG/5ML syrup   Oral   Take 5 mLs by mouth 4 (four) times daily as needed. Mixed with 5 mL of viscous lidocaine for swish and swallow.   120 mL   0   . budesonide-formoterol (SYMBICORT) 80-4.5 MCG/ACT inhaler   Inhalation   Inhale 2 puffs into the lungs 2 (two) times daily as needed (shortness of breath).         . cyclobenzaprine (FLEXERIL) 10 MG tablet   Oral   Take 1 tablet (10 mg total) by mouth every 8 (eight) hours as needed for muscle spasms.   15 tablet   0   . cyclobenzaprine (FLEXERIL)  5 MG tablet   Oral   Take 1 tablet (5 mg total) by mouth 3 (three) times daily as needed for muscle spasms.   10 tablet   0   . DULoxetine (CYMBALTA) 20 MG capsule   Oral   Take 20 mg by mouth daily.         . ferrous gluconate (FERGON) 324 MG tablet   Oral   Take 324 mg by mouth daily with breakfast.         . HYDROcodone-acetaminophen (NORCO/VICODIN) 5-325 MG per tablet   Oral   Take 1 tablet by mouth every 6 (six) hours as needed.   10 tablet   0   . levothyroxine (SYNTHROID, LEVOTHROID) 125 MCG tablet   Oral   Take 125 mcg by mouth daily before breakfast.         . lidocaine (XYLOCAINE) 2 % solution   Mouth/Throat   Use as directed 5 mLs in the mouth or throat every 6 (six) hours as needed for mouth pain. Mixed with 5 mL Bromfed-DM for swish and swallow.   100 mL   0   . metroNIDAZOLE (FLAGYL) 500 MG tablet   Oral   Take 1 tablet (500 mg total) by mouth 2 (two) times daily.   [place]  tablet   0   . Multiple Vitamin (MULTIVITAMIN WITH MINERALS) TABS tablet   Oral   Take 2 tablets by mouth daily.         . vitamin B-12 (CYANOCOBALAMIN) 1000 MCG tablet   Oral   Take 1,000 mcg by mouth daily.         . Vitamin D, Ergocalciferol, (DRISDOL) 50000 UNITS CAPS capsule   Oral   Take 50,000 Units by mouth every 7 (seven) days. On Wednesday.           Allergies Ibuprofen and Nsaids  No family history on file.  Social History Social History  Substance Use Topics  . Smoking status: Current Every Day Smoker -- 1.00 packs/day    Types: Cigarettes  . Smokeless tobacco: None  . Alcohol Use: No    Review of Systems Constitutional: No fever/chills Eyes: No visual changes. ENT: No sore throat. Cardiovascular: Denies chest pain. Respiratory: Denies shortness of breath. Gastrointestinal: No abdominal pain.  No nausea, no vomiting.  No diarrhea.  No constipation. Genitourinary: Negative for dysuria. Musculoskeletal: Negative for back pain. Skin: Negative  for rash. Neurological: Negative for headaches, focal weakness or numbness. Endocrine:Hypothyroidism Allergic/Immunilogical: NSAIDs  10-point ROS otherwise negative.  ____________________________________________   PHYSICAL EXAM:  VITAL SIGNS: ED Triage Vitals  Enc Vitals Group     BP 08/20/15 1935 101/60 mmHg     Pulse Rate 08/20/15 1935 76     Resp 08/20/15 1935 18     Temp 08/20/15 1935 98.4 F (36.9 C)     Temp Source 08/20/15 1935 Oral     SpO2 08/20/15 1935 100 %     Weight 08/20/15 1935 135 lb (61.236 kg)     Height 08/20/15 1935 5\' 3"  (1.6 m)     Head Cir --      Peak Flow --      Pain Score 08/20/15 1933 9     Pain Loc --      Pain Edu? --      Excl. in [place]? --     Constitutional: Alert and oriented. Well appearing and in no acute distress. Eyes: Conjunctivae are normal. PERRL. EOMI. Head: Atraumatic. Nose: No congestion/rhinnorhea. Mouth/Throat: Mucous membranes are moist.  Oropharynx erythematous without exudate Neck: No stridor.  No cervical spine tenderness to palpation. Hematological/Lymphatic/Immunilogical: Bilateral cervical lymphadenopathy. Cardiovascular: Normal rate, regular rhythm. Grossly normal heart sounds.  Good peripheral circulation. Respiratory: Normal respiratory effort.  No retractions. Lungs CTAB. Gastrointestinal: Soft and nontender. No distention. No abdominal bruits. No CVA tenderness. Musculoskeletal: No spinal deformity. Decreased range of motion with flexion with bilateral paraspinal muscle spasms. Patient has negative straight leg raise bilaterally. Neurologic:  Normal speech and language. No gross focal neurologic deficits are appreciated. No gait instability. Skin:  Skin is warm, dry and intact. No rash noted. Psychiatric: Mood and affect are normal. Speech and behavior are normal.  ____________________________________________   LABS (all labs ordered are listed, but only abnormal results are displayed)  Labs Reviewed  CULTURE,  GROUP A STREP ([facility] ONLY)  POCT RAPID STREP A   ____________________________________________  EKG   ____________________________________________  RADIOLOGY   ____________________________________________   PROCEDURES  Procedure(s) performed: None  Critic Her family doctor if condition persists.al Care performed: No  ____________________________________________   INITIAL IMPRESSION / ASSESSMENT AND PLAN / ED COURSE  Pertinent labs & imaging results that were available during my care of the patient were reviewed by me and considered in my medical decision making (see chart  for details).  Viral pharyngitis and lumbar strain. Discussed negative rapid strep test results with patient and advised culture is pending. Patient given discharge instructions on supportive care for viral pharyngitis. Lumbar strain. Patient is given discharge care instructions and a prescription for Flexeril. ____________________________________________   FINAL CLINICAL IMPRESSION(S) / ED DIAGNOSES  Final diagnoses:  Viral pharyngitis  Lumbar strain, initial encounter      Sable Feil, PA-C 08/20/15 2031  Delman Kitten, MD 08/20/15 [phone]

## 2015-08-20 NOTE — ED Notes (Signed)
Patient ambulatory to triage with steady gait, without difficulty or distress noted; pt reports sore throat today with no accomp symptoms

## 2015-08-22 LAB — CULTURE, GROUP A STREP (THRC)

## 2015-12-18 ENCOUNTER — Emergency Department
Admission: EM | Admit: 2015-12-18 | Discharge: 2015-12-19 | Disposition: A | Discharge status: Home / Self Care | Payer: 59 | Attending: Emergency Medicine | Admitting: Emergency Medicine

## 2015-12-18 DIAGNOSIS — R55 Syncope and collapse: Secondary | ICD-10-CM | POA: Insufficient documentation

## 2015-12-18 DIAGNOSIS — Z5321 Procedure and treatment not carried out due to patient leaving prior to being seen by health care provider: Secondary | ICD-10-CM | POA: Insufficient documentation

## 2015-12-18 LAB — COMPREHENSIVE METABOLIC PANEL
ALT: 15 U/L (ref 14–54)
AST: 19 U/L (ref 15–41)
Albumin: 4 g/dL (ref 3.5–5.0)
Alkaline Phosphatase: 69 U/L (ref 38–126)
Anion gap: 5 (ref 5–15)
BUN: 7 mg/dL (ref 6–20)
CO2: 27 mmol/L (ref 22–32)
Calcium: 8.7 mg/dL — ABNORMAL LOW (ref 8.9–10.3)
Chloride: 109 mmol/L (ref 101–111)
Creatinine, Ser: 0.52 mg/dL (ref 0.44–1.00)
GFR calc Af Amer: >60 mL/min (ref >60)
GFR calc non Af Amer: >60 mL/min (ref >60)
Glucose, Bld: 100 mg/dL — ABNORMAL HIGH (ref 65–99)
Potassium: 3.9 mmol/L (ref 3.5–5.1)
Sodium: 141 mmol/L (ref 135–145)
Total Bilirubin: <0.1 mg/dL — ABNORMAL LOW (ref 0.3–1.2)
Total Protein: 7.1 g/dL (ref 6.5–8.1)

## 2015-12-18 LAB — URINALYSIS COMPLETE WITH MICROSCOPIC (ARMC ONLY)
Bacteria, UA: NONE SEEN
Bilirubin Urine: NEGATIVE
Glucose, UA: NEGATIVE mg/dL
Hgb urine dipstick: NEGATIVE
Ketones, ur: NEGATIVE mg/dL
Leukocytes, UA: NEGATIVE
Nitrite: NEGATIVE
Protein, ur: NEGATIVE mg/dL
RBC / HPF: NONE SEEN RBC/hpf (ref 0–5)
Specific Gravity, Urine: 1.018 (ref 1.005–1.030)
pH: 6 (ref 5.0–8.0)

## 2015-12-18 LAB — CBC
HCT: 38.6 % (ref 35.0–47.0)
Hemoglobin: 12.5 g/dL (ref 12.0–16.0)
MCH: 27.9 pg (ref 26.0–34.0)
MCHC: 32.4 g/dL (ref 32.0–36.0)
MCV: 86.2 fL (ref 80.0–100.0)
Platelets: 274 10*3/uL (ref 150–440)
RBC: 4.48 MIL/uL (ref 3.80–5.20)
RDW: 16.2 % — ABNORMAL HIGH (ref 11.5–14.5)
WBC: 8.6 10*3/uL (ref 3.6–11.0)

## 2015-12-18 LAB — TROPONIN I: Troponin I: <0.03 ng/mL (ref <0.031)

## 2015-12-18 LAB — LIPASE, BLOOD: Lipase: 16 U/L (ref 11–51)

## 2015-12-18 LAB — PREGNANCY, URINE: Preg Test, Ur: NEGATIVE

## 2015-12-18 NOTE — ED Notes (Signed)
Pt in with dizziness and syncope today, has been nauseous and weak for 2 days.  Has had vomiting x 2 and some diarrhea.  Pt co epigastric pain no hx of reflux.

## 2015-12-18 NOTE — ED Notes (Signed)
Patient EKG orders, lab orders and urine put in and then had 2 patients at the same time that needed blood, urine and EKG. Tried to get to the as fast as I could.

## 2016-02-27 ENCOUNTER — Emergency Department
Admission: EM | Admit: 2016-02-27 | Discharge: 2016-02-27 | Disposition: A | Discharge status: Home / Self Care | Payer: 59 | Attending: Emergency Medicine | Admitting: Emergency Medicine

## 2016-02-27 DIAGNOSIS — F17210 Nicotine dependence, cigarettes, uncomplicated: Secondary | ICD-10-CM | POA: Insufficient documentation

## 2016-02-27 DIAGNOSIS — Z79899 Other long term (current) drug therapy: Secondary | ICD-10-CM | POA: Insufficient documentation

## 2016-02-27 DIAGNOSIS — IMO0002 Reserved for concepts with insufficient information to code with codable children: Secondary | ICD-10-CM

## 2016-02-27 DIAGNOSIS — E079 Disorder of thyroid, unspecified: Secondary | ICD-10-CM | POA: Insufficient documentation

## 2016-02-27 DIAGNOSIS — F1019 Alcohol abuse with unspecified alcohol-induced disorder: Secondary | ICD-10-CM | POA: Insufficient documentation

## 2016-02-27 NOTE — Discharge Instructions (Signed)

## 2016-02-27 NOTE — ED Provider Notes (Signed)
[facility] Emergency Department Provider Note        Time seen: ----------------------------------------- 8:53 AM on 02/27/2016 -----------------------------------------    I have reviewed the triage vital signs and the nursing notes.   HISTORY  Chief Complaint Alcohol Problem    HPI Kelsey Donovan is a 25 y.o. female who presents to ER for medical clearance for [facility]. Patient reports she drinks a bottle of vodka most days. She's lost her job, her license and will lose her children if she doesn't get alcohol problem under control. She was told to come to ER for medical clearance.Patient denies any medical complaints.   Past Medical History  Diagnosis Date  . Thyroid disease   . Renal disorder     kidney stones    There are no active problems to display for this patient.   Past Surgical History  Procedure Laterality Date  . Tonsillectomy    . Gastric bybpass      Allergies Ibuprofen and Nsaids  Social History Social History  Substance Use Topics  . Smoking status: Current Every Day Smoker -- 1.00 packs/day    Types: Cigarettes  . Smokeless tobacco: None  . Alcohol Use: Yes    Review of Systems Constitutional: Negative for fever. Cardiovascular: Negative for chest pain. Respiratory: Negative for shortness of breath. Gastrointestinal: Negative for abdominal pain, vomiting and diarrhea. Genitourinary: Negative for dysuria. Musculoskeletal: Negative for back pain. Skin: Negative for rash. Neurological: Negative for headaches, focal weakness or numbness.  10-point ROS otherwise negative.  ____________________________________________   PHYSICAL EXAM:  VITAL SIGNS: ED Triage Vitals  Enc Vitals Group     BP 02/27/16 0843 125/75 mmHg     Pulse Rate 02/27/16 0842 98     Resp 02/27/16 0842 18     Temp 02/27/16 0842 98.3 F (36.8 C)     Temp Source 02/27/16 0842 Oral     SpO2 02/27/16 0842 99 %     Weight 02/27/16 0841 145 lb  (65.772 kg)     Height 02/27/16 0841 5\' 3"  (1.6 m)     Head Cir --      Peak Flow --      Pain Score --      Pain Loc --      Pain Edu? --      Excl. in [place]? --     Constitutional: Alert and oriented. Well appearing and in no distress. Eyes: Conjunctivae are normal. PERRL. Normal extraocular movements. ENT   Head: Normocephalic and atraumatic.   Nose: No congestion/rhinnorhea.   Mouth/Throat: Mucous membranes are moist.   Neck: No stridor. Cardiovascular: Normal rate, regular rhythm. No murmurs, rubs, or gallops. Respiratory: Normal respiratory effort without tachypnea nor retractions. Breath sounds are clear and equal bilaterally. No wheezes/rales/rhonchi. Gastrointestinal: Soft and nontender. Normal bowel sounds Musculoskeletal: Nontender with normal range of motion in all extremities. No lower extremity tenderness nor edema. Neurologic:  Normal speech and language. No gross focal neurologic deficits are appreciated.  Skin:  Skin is warm, dry and intact. No rash noted. Psychiatric: Mood and affect are normal. Speech and behavior are normal.  ___________________________________________  ED COURSE:  Pertinent labs & imaging results that were available during my care of the patient were reviewed by me and considered in my medical decision making (see chart for details). No acute emergency medical condition is identified, she is stable for discharge  ____________________________________________  FINAL ASSESSMENT AND PLAN  Chronic alcohol abuse  Plan: Patient with chronic alcoholism.  She is encouraged to follow up without fail at the detox center.   Earleen [place], MD   Note: This dictation was prepared with Dragon dictation. Any transcriptional errors that result from this process are unintentional `   Earleen [place], MD 02/27/16 [phone]

## 2016-02-27 NOTE — ED Notes (Signed)
Pt states she is here for alcohol detox , states she has already contacted [facility] and told to come here for an eval.. Pt states he last drink was last night.

## 2016-02-27 NOTE — ED Notes (Signed)
Called [facility] for pt pickup. They requested her info be faxed to them. Pt signed release of PHI and it was faxed to [facility]. Will continue to monitor and await their reply.

## 2016-02-27 NOTE — ED Notes (Signed)
Pt discharged home after verbalizing understanding of discharge instructions; nad noted. 

## 2016-02-27 NOTE — ED Notes (Signed)
Pt presents to be medically cleared to go to [facility]. States that she drinks "a bottle of vodka" almost every day. Pt states that she has lost her job, her license, and will lose her children if she doesn't get a handle on the alcohol problem. Pt also states that she had gastric bypass and wants to make sure she has no problems with it.

## 2016-04-18 ENCOUNTER — Emergency Department
Admission: EM | Admit: 2016-04-18 | Discharge: 2016-04-18 | Disposition: A | Discharge status: Home / Self Care | Payer: Self-pay | Attending: Emergency Medicine | Admitting: Emergency Medicine

## 2016-04-18 ENCOUNTER — Encounter: Payer: Self-pay | Admitting: Radiology

## 2016-04-18 ENCOUNTER — Emergency Department: Payer: Self-pay

## 2016-04-18 DIAGNOSIS — F17210 Nicotine dependence, cigarettes, uncomplicated: Secondary | ICD-10-CM | POA: Insufficient documentation

## 2016-04-18 DIAGNOSIS — R1031 Right lower quadrant pain: Secondary | ICD-10-CM | POA: Insufficient documentation

## 2016-04-18 DIAGNOSIS — R112 Nausea with vomiting, unspecified: Secondary | ICD-10-CM | POA: Insufficient documentation

## 2016-04-18 DIAGNOSIS — Z79899 Other long term (current) drug therapy: Secondary | ICD-10-CM | POA: Insufficient documentation

## 2016-04-18 DIAGNOSIS — R197 Diarrhea, unspecified: Secondary | ICD-10-CM | POA: Insufficient documentation

## 2016-04-18 LAB — URINALYSIS COMPLETE WITH MICROSCOPIC (ARMC ONLY)
Bilirubin Urine: NEGATIVE
Glucose, UA: NEGATIVE mg/dL
Hgb urine dipstick: NEGATIVE
Ketones, ur: NEGATIVE mg/dL
Leukocytes, UA: NEGATIVE
Nitrite: NEGATIVE
Protein, ur: NEGATIVE mg/dL
Specific Gravity, Urine: 1.021 (ref 1.005–1.030)
pH: 5 (ref 5.0–8.0)

## 2016-04-18 LAB — CBC
HCT: 41.8 % (ref 35.0–47.0)
Hemoglobin: 13.5 g/dL (ref 12.0–16.0)
MCH: 27.3 pg (ref 26.0–34.0)
MCHC: 32.3 g/dL (ref 32.0–36.0)
MCV: 84.4 fL (ref 80.0–100.0)
Platelets: 279 10*3/uL (ref 150–440)
RBC: 4.96 MIL/uL (ref 3.80–5.20)
RDW: 19.3 % — ABNORMAL HIGH (ref 11.5–14.5)
WBC: 10.1 10*3/uL (ref 3.6–11.0)

## 2016-04-18 LAB — COMPREHENSIVE METABOLIC PANEL
ALT: 15 U/L (ref 14–54)
AST: 26 U/L (ref 15–41)
Albumin: 4 g/dL (ref 3.5–5.0)
Alkaline Phosphatase: 96 U/L (ref 38–126)
Anion gap: 9 (ref 5–15)
BUN: 11 mg/dL (ref 6–20)
CO2: 22 mmol/L (ref 22–32)
Calcium: 8.4 mg/dL — ABNORMAL LOW (ref 8.9–10.3)
Chloride: 107 mmol/L (ref 101–111)
Creatinine, Ser: 0.6 mg/dL (ref 0.44–1.00)
GFR calc Af Amer: >60 mL/min (ref >60)
GFR calc non Af Amer: >60 mL/min (ref >60)
Glucose, Bld: 116 mg/dL — ABNORMAL HIGH (ref 65–99)
Potassium: 3.6 mmol/L (ref 3.5–5.1)
Sodium: 138 mmol/L (ref 135–145)
Total Bilirubin: 0.2 mg/dL — ABNORMAL LOW (ref 0.3–1.2)
Total Protein: 7.6 g/dL (ref 6.5–8.1)

## 2016-04-18 LAB — LIPASE, BLOOD: Lipase: 17 U/L (ref 11–51)

## 2016-04-18 LAB — POCT PREGNANCY, URINE: Preg Test, Ur: NEGATIVE

## 2016-04-18 MED ORDER — ONDANSETRON HCL 4 MG/2ML IJ SOLN
4.0000 mg | Freq: Once | INTRAMUSCULAR | Status: AC
  Administered 2016-04-18: 4 mg via INTRAVENOUS

## 2016-04-18 MED ORDER — ONDANSETRON 4 MG PO TBDP
4.0000 mg | ORAL_TABLET | Freq: Once | ORAL | Status: AC | PRN
  Administered 2016-04-18: 4 mg via ORAL

## 2016-04-18 MED ORDER — ONDANSETRON 4 MG PO TBDP: 4.0000 mg | ORAL_TABLET | Freq: Three times a day (TID) | ORAL | 0 refills | Status: DC | PRN

## 2016-04-18 MED ORDER — OXYCODONE-ACETAMINOPHEN 5-325 MG PO TABS: 1.0000 | ORAL_TABLET | Freq: Four times a day (QID) | ORAL | 0 refills | Status: DC | PRN

## 2016-04-18 MED ORDER — DIATRIZOATE MEGLUMINE & SODIUM 66-10 % PO SOLN
15.0000 mL | Freq: Once | ORAL | Status: AC
  Administered 2016-04-18: 15 mL via ORAL

## 2016-04-18 MED ORDER — ONDANSETRON HCL 4 MG/2ML IJ SOLN
INTRAMUSCULAR | Status: AC
  Administered 2016-04-18: 4 mg via INTRAVENOUS
  Filled 2016-04-18: qty 2

## 2016-04-18 MED ORDER — PROMETHAZINE HCL 25 MG/ML IJ SOLN
12.5000 mg | Freq: Once | INTRAMUSCULAR | Status: AC
  Administered 2016-04-18: 12.5 mg via INTRAVENOUS
  Filled 2016-04-18: qty 1

## 2016-04-18 MED ORDER — HYDROMORPHONE HCL 1 MG/ML IJ SOLN
INTRAMUSCULAR | Status: AC
  Administered 2016-04-18: 1 mg via INTRAVENOUS
  Filled 2016-04-18: qty 1

## 2016-04-18 MED ORDER — SODIUM CHLORIDE 0.9 % IV BOLUS (SEPSIS)
1000.0000 mL | Freq: Once | INTRAVENOUS | Status: AC
  Administered 2016-04-18: 1000 mL via INTRAVENOUS

## 2016-04-18 MED ORDER — IOPAMIDOL (ISOVUE-300) INJECTION 61%
100.0000 mL | Freq: Once | INTRAVENOUS | Status: AC | PRN
  Administered 2016-04-18: 100 mL via INTRAVENOUS

## 2016-04-18 MED ORDER — ONDANSETRON 4 MG PO TBDP
ORAL_TABLET | ORAL | Status: AC
  Filled 2016-04-18: qty 1

## 2016-04-18 MED ORDER — HYDROMORPHONE HCL 1 MG/ML IJ SOLN
1.0000 mg | Freq: Once | INTRAMUSCULAR | Status: AC
  Administered 2016-04-18: 1 mg via INTRAVENOUS

## 2016-04-18 NOTE — Discharge Instructions (Signed)
Pain Medicine Instructions °HOW CAN PAIN MEDICINE AFFECT ME? °You were given a prescription for pain medicine. This medicine may make you tired or drowsy and may affect your ability to think clearly. Pain medicine may also affect your ability to drive or perform certain physical activities. It may not be possible to make all of your pain go away, but you should be comfortable enough to move, breathe, and take care of yourself. °HOW OFTEN SHOULD I TAKE PAIN MEDICINE AND HOW MUCH SHOULD I TAKE? °Take pain medicine only as directed by your health care provider and only as needed for pain. °You do not need to take pain medicine if you are not having pain, unless directed by your health care provider. °You can take less than the prescribed dose if you find that a smaller amount of medicine controls your pain. °WHAT RESTRICTIONS DO I HAVE WHILE TAKING PAIN MEDICINE? °Follow these instructions after you start taking pain medicine, while you are taking the medicine, and for 8 hours after you stop taking the medicine: °Do not drive. °Do not operate machinery. °Do not operate power tools. °Do not sign legal documents. °Do not drink alcohol. °Do not take sleeping pills. °Do not supervise children by yourself. °Do not participate in activities that require climbing or being in high places. °Do not enter a body of water--such as a lake, river, ocean, spa, or swimming pool--without an adult nearby who can monitor and help you. °HOW CAN I KEEP OTHERS SAFE WHILE I AM TAKING PAIN MEDICINE? °Store your pain medicine as directed by your health care provider. Make sure that it is placed where children and pets cannot reach it. °Never share your pain medicine with anyone. °Do not save any leftover pills. If you have any leftover pain medicine, get rid of it or destroy it as directed by your health care provider. °WHAT ELSE DO I NEED TO KNOW ABOUT TAKING PAIN MEDICINE? °Use a stool softener if you become constipated from your pain  medicine. Increasing your intake of fruits and vegetables will also help with constipation. °Write down the times when you take your pain medicine. Look at the times before you take your next dose of medicine. It is easy to become confused while on pain medicine. Recording the times helps you to avoid an overdose. °If your pain is severe, do not try to treat it yourself by taking more pills than instructed on your prescription. Contact your health care provider for help. °You may have been prescribed a pain medicine that contains acetaminophen. Do not take any other acetaminophen while taking this medicine. An overdose of acetaminophen can result in severe liver damage. Acetaminophen is found in many over-the-counter (OTC) and prescription medicines. If you are taking any medicines in addition to your pain medicine, check the active ingredients on those medicines to see if acetaminophen is listed. °WHEN SHOULD I CALL MY HEALTH CARE PROVIDER? °Your medicine is not helping to make the pain go away. °You vomit or have diarrhea shortly after taking the medicine. °You develop new pain in areas that did not hurt before. °You have an allergic reaction to your medicine. This may include: °Itchiness. °Swelling. °Dizziness. °Developing a new rash. °WHEN SHOULD I CALL 911 OR GO TO THE EMERGENCY ROOM? °You feel dizzy or you faint. °You are very confused or disoriented. °You repeatedly vomit. °Your skin or lips turn pale or bluish in color. °You have shortness of breath or you are breathing much more slowly than usual. °You have   a severe allergic reaction to your medicine. This includes: °Developing tongue swelling. °Having difficulty breathing. °  °This information is not intended to replace advice given to you by your health care provider. Make sure you discuss any questions you have with your health care provider. °  °Document Released: 11/24/2000 Document Revised: 01/02/2015 Document Reviewed: 06/22/2014 °Elsevier  Interactive Patient Education ©2016 [employer] ° °

## 2016-04-18 NOTE — ED Triage Notes (Signed)
Patient reports right lower quad pain for couple of days with nausea and vomiting.

## 2016-04-18 NOTE — ED Provider Notes (Signed)
[facility] Emergency Department Provider Note  ____________________________________________  Time seen: Approximately 9:38 PM  I have reviewed the triage vital signs and the nursing notes.   HISTORY  Chief Complaint Abdominal Pain    HPI Kelsey Donovan is a 25 y.o. female who complains of gradual onset right lower quadrant abdominal pain for the past 4 days. Gradually worsening and worsening. Not colicky. Not intermittent. Associated with nausea vomiting and diarrhea for the past few days. Very poor oral intake over the last 3 or 4 days. No fevers or chills. Nonradiating, no aggravating or alleviating factors. Severe, aching.  LMP was July 26.   Past Medical History:  Diagnosis Date  . Renal disorder    kidney stones  . Thyroid disease      There are no active problems to display for this patient.    Past Surgical History:  Procedure Laterality Date  . gastric bybpass    . TONSILLECTOMY       Prior to Admission medications   Medication Sig Start Date End Date Taking? Authorizing Provider  acetaminophen (TYLENOL) 500 MG tablet Take 1,000 mg by mouth every 6 (six) hours as needed for moderate pain.   Yes Historical Provider, MD  amphetamine-dextroamphetamine (ADDERALL) 30 MG tablet Take 30 mg by mouth 2 (two) times daily.   Yes Historical Provider, MD  budesonide-formoterol (SYMBICORT) 80-4.5 MCG/ACT inhaler Inhale 2 puffs into the lungs 2 (two) times daily as needed (shortness of breath).   Yes Historical Provider, MD  ferrous gluconate (FERGON) 324 MG tablet Take 324 mg by mouth daily with breakfast.   Yes Historical Provider, MD  levothyroxine (SYNTHROID, LEVOTHROID) 125 MCG tablet Take 125 mcg by mouth daily before breakfast.   Yes Historical Provider, MD  Multiple Vitamin (MULTIVITAMIN WITH MINERALS) TABS tablet Take 2 tablets by mouth daily.   Yes Historical Provider, MD  vitamin B-12 (CYANOCOBALAMIN) 1000 MCG tablet Take 1,000 mcg by mouth  daily.   Yes Historical Provider, MD  Vitamin D, Ergocalciferol, (DRISDOL) 50000 UNITS CAPS capsule Take 50,000 Units by mouth every 7 (seven) days. On Wednesday.   Yes Historical Provider, MD  brompheniramine-pseudoephedrine-DM 30-2-10 MG/5ML syrup Take 5 mLs by mouth 4 (four) times daily as needed. Mixed with 5 mL of viscous lidocaine for swish and swallow. 08/20/15   Sable Feil, PA-C  cyclobenzaprine (FLEXERIL) 10 MG tablet Take 1 tablet (10 mg total) by mouth every 8 (eight) hours as needed for muscle spasms. 08/20/15   Sable Feil, PA-C  cyclobenzaprine (FLEXERIL) 5 MG tablet Take 1 tablet (5 mg total) by mouth 3 (three) times daily as needed for muscle spasms. 04/11/15   Quintella Reichert, MD  DULoxetine (CYMBALTA) 20 MG capsule Take 20 mg by mouth daily.    Historical Provider, MD  HYDROcodone-acetaminophen (NORCO/VICODIN) 5-325 MG per tablet Take 1 tablet by mouth every 6 (six) hours as needed. 04/11/15   Quintella Reichert, MD  lidocaine (XYLOCAINE) 2 % solution Use as directed 5 mLs in the mouth or throat every 6 (six) hours as needed for mouth pain. Mixed with 5 mL Bromfed-DM for swish and swallow. 08/20/15   Sable Feil, PA-C  metroNIDAZOLE (FLAGYL) 500 MG tablet Take 1 tablet (500 mg total) by mouth 2 (two) times daily. 04/11/15   Quintella Reichert, MD  ondansetron (ZOFRAN ODT) 4 MG disintegrating tablet Take 1 tablet (4 mg total) by mouth every 8 (eight) hours as needed for nausea or vomiting. 04/18/16   Carrie Mew, MD  oxyCODONE-acetaminophen (ROXICET) 5-325 MG tablet Take 1 tablet by mouth every 6 (six) hours as needed for severe pain. 04/18/16   Carrie Mew, MD     Allergies Ibuprofen and Nsaids   No family history on file.  Social History Social History  Substance Use Topics  . Smoking status: Current Every Day Smoker    Packs/day: 1.00    Types: Cigarettes  . Smokeless tobacco: Not on file  . Alcohol use Yes    Review of Systems  Constitutional:   No fever or  chills.  ENT:   No sore throat. No rhinorrhea. Cardiovascular:   No chest pain. Respiratory:   No dyspnea or cough. Gastrointestinal:   Abdominal pain with vomiting and diarrhea.  Genitourinary:   Negative for dysuria or difficulty urinating. Musculoskeletal:   Negative for focal pain or swelling Neurological:   Negative for headaches 10-point ROS otherwise negative.  ____________________________________________   PHYSICAL EXAM:  VITAL SIGNS: ED Triage Vitals  Enc Vitals Group     BP 04/18/16 1927 117/68     Pulse Rate 04/18/16 1923 (!) 116     Resp 04/18/16 1923 (!) 22     Temp 04/18/16 1923 98.3 F (36.8 C)     Temp Source 04/18/16 1923 Oral     SpO2 04/18/16 1923 96 %     Weight 04/18/16 1925 163 lb (73.9 kg)     Height 04/18/16 1925 5\' 3"  (1.6 m)     Head Circumference --      Peak Flow --      Pain Score 04/18/16 1925 10     Pain Loc --      Pain Edu? --      Excl. in [place]? --     Vital signs reviewed, nursing assessments reviewed.   Constitutional:   Alert and oriented. Well appearing and in no distress. Eyes:   No scleral icterus. No conjunctival pallor. PERRL. EOMI.  No nystagmus. ENT   Head:   Normocephalic and atraumatic.   Nose:   No congestion/rhinnorhea. No septal hematoma   Mouth/Throat:   MMM, no pharyngeal erythema. No peritonsillar mass.    Neck:   No stridor. No SubQ emphysema. No meningismus. Hematological/Lymphatic/Immunilogical:   No cervical lymphadenopathy. Cardiovascular:   RRR. Symmetric bilateral radial and DP pulses.  No murmurs.  Respiratory:   Normal respiratory effort without tachypnea nor retractions. Breath sounds are clear and equal bilaterally. No wheezes/rales/rhonchi. Gastrointestinal:   Soft With severe right lower quadrant abdominal tenderness. Non distended. There is no CVA tenderness.  Positive rebound without rigidity. Genitourinary:   deferred Musculoskeletal:   Nontender with normal range of motion in all  extremities. No joint effusions.  No lower extremity tenderness.  No edema. Neurologic:   Normal speech and language.  CN 2-10 normal. Motor grossly intact. No gross focal neurologic deficits are appreciated.  Skin:    Skin is warm, dry and intact. No rash noted.  No petechiae, purpura, or bullae.  ____________________________________________    LABS (pertinent positives/negatives) (all labs ordered are listed, but only abnormal results are displayed) Labs Reviewed  COMPREHENSIVE METABOLIC PANEL - Abnormal; Notable for the following:       Result Value   Glucose, Bld 116 (*)    Calcium 8.4 (*)    Total Bilirubin 0.2 (*)    All other components within normal limits  CBC - Abnormal; Notable for the following:    RDW 19.3 (*)    All other components within normal limits  URINALYSIS COMPLETEWITH MICROSCOPIC ([facility] ONLY) - Abnormal; Notable for the following:    Color, Urine YELLOW (*)    APPearance CLEAR (*)    Bacteria, UA RARE (*)    Squamous Epithelial / LPF 0-5 (*)    All other components within normal limits  LIPASE, BLOOD  POC URINE PREG, ED  POCT PREGNANCY, URINE   ____________________________________________   EKG    ____________________________________________    RADIOLOGY  CT abdomen pelvis negative  ____________________________________________   PROCEDURES Procedures  ____________________________________________   INITIAL IMPRESSION / ASSESSMENT AND PLAN / ED COURSE  Pertinent labs & imaging results that were available during my care of the patient were reviewed by me and considered in my medical decision making (see chart for details).  Patient presents with right lower quadrant abdominal pain and tenderness. By history and exam, low suspicion for torsion or ectopic pregnancy. Low suspicion for PID. Exam is concerning for appendicitis. We'll get CT scan to evaluate. IV fluids, IV Dilaudid and Zofran.     Clinical Course     ----------------------------------------- 11:13 PM on 04/18/2016 -----------------------------------------  Repeat abdominal exam improved. Patient calm and comfortable. CT negative. Labs unremarkable. Likely related to the onset of her menses or follicular eruption. No evidence of ovarian cyst. We'll prescribe pain medications. [place] substance reporting system reviewed. Follow-up in 2 or 3 days with primary care if symptoms not resolved. ____________________________________________   FINAL CLINICAL IMPRESSION(S) / ED DIAGNOSES  Final diagnoses:  Right lower quadrant abdominal pain       Portions of this note were generated with dragon dictation software. Dictation errors may occur despite best attempts at proofreading.    Carrie Mew, MD 04/18/16 [phone]

## 2016-04-18 NOTE — ED Notes (Signed)
Pt reports hx of kidney stones

## 2016-04-18 NOTE — ED Notes (Signed)
Pt states unable to give urine at this time 

## 2016-04-18 NOTE — ED Notes (Signed)
Pt finished with contrast. CT called already.

## 2016-04-21 ENCOUNTER — Emergency Department
Admission: EM | Admit: 2016-04-21 | Discharge: 2016-04-21 | Disposition: A | Discharge status: Home / Self Care | Payer: Medicaid Other | Attending: Emergency Medicine | Admitting: Emergency Medicine

## 2016-04-21 DIAGNOSIS — K529 Noninfective gastroenteritis and colitis, unspecified: Secondary | ICD-10-CM

## 2016-04-21 DIAGNOSIS — F17210 Nicotine dependence, cigarettes, uncomplicated: Secondary | ICD-10-CM | POA: Insufficient documentation

## 2016-04-21 LAB — COMPREHENSIVE METABOLIC PANEL
ALT: 15 U/L (ref 14–54)
AST: 25 U/L (ref 15–41)
Albumin: 3.8 g/dL (ref 3.5–5.0)
Alkaline Phosphatase: 90 U/L (ref 38–126)
Anion gap: 6 (ref 5–15)
BUN: 11 mg/dL (ref 6–20)
CO2: 26 mmol/L (ref 22–32)
Calcium: 8.9 mg/dL (ref 8.9–10.3)
Chloride: 105 mmol/L (ref 101–111)
Creatinine, Ser: 0.49 mg/dL (ref 0.44–1.00)
GFR calc Af Amer: >60 mL/min (ref >60)
GFR calc non Af Amer: >60 mL/min (ref >60)
Glucose, Bld: 94 mg/dL (ref 65–99)
Potassium: 4.5 mmol/L (ref 3.5–5.1)
Sodium: 137 mmol/L (ref 135–145)
Total Bilirubin: 0.6 mg/dL (ref 0.3–1.2)
Total Protein: 7.4 g/dL (ref 6.5–8.1)

## 2016-04-21 LAB — CBC
HCT: 40.3 % (ref 35.0–47.0)
Hemoglobin: 13.2 g/dL (ref 12.0–16.0)
MCH: 27.4 pg (ref 26.0–34.0)
MCHC: 32.7 g/dL (ref 32.0–36.0)
MCV: 83.7 fL (ref 80.0–100.0)
Platelets: 239 10*3/uL (ref 150–440)
RBC: 4.81 MIL/uL (ref 3.80–5.20)
RDW: 19.7 % — ABNORMAL HIGH (ref 11.5–14.5)
WBC: 7.4 10*3/uL (ref 3.6–11.0)

## 2016-04-21 LAB — LIPASE, BLOOD: Lipase: 17 U/L (ref 11–51)

## 2016-04-21 MED ORDER — ONDANSETRON 4 MG PO TBDP
8.0000 mg | ORAL_TABLET | Freq: Once | ORAL | Status: AC
  Administered 2016-04-21: 8 mg via ORAL
  Filled 2016-04-21: qty 2

## 2016-04-21 MED ORDER — PROMETHAZINE HCL 25 MG PO TABS: 25.0000 mg | ORAL_TABLET | Freq: Four times a day (QID) | ORAL | 0 refills | Status: DC | PRN

## 2016-04-21 NOTE — ED Provider Notes (Signed)
[facility] Emergency Department Provider Note   ____________________________________________    I have reviewed the triage vital signs and the nursing notes.   HISTORY  Chief Complaint Abdominal Pain     HPI Kelsey Donovan is a 25 y.o. female who presents with complaints of nausea vomiting and diarrhea. Patient was seen 2 days ago for similar complaints. At that time she had lab work and a CT scan done which was reassuring. She can use to have mild nausea and vomiting which she attributes to not being able to afford the nausea medication as prescribed. She claims to have diarrhea. Intermittent abdominal cramping. No fevers or chills. Mild body aches.   Past Medical History:  Diagnosis Date  . Renal disorder    kidney stones  . Thyroid disease     There are no active problems to display for this patient.   Past Surgical History:  Procedure Laterality Date  . gastric bybpass    . TONSILLECTOMY      Prior to Admission medications   Medication Sig Start Date End Date Taking? Authorizing Provider  acetaminophen (TYLENOL) 500 MG tablet Take 1,000 mg by mouth every 6 (six) hours as needed for moderate pain.    Historical Provider, MD  amphetamine-dextroamphetamine (ADDERALL) 30 MG tablet Take 30 mg by mouth 2 (two) times daily.    Historical Provider, MD  brompheniramine-pseudoephedrine-DM 30-2-10 MG/5ML syrup Take 5 mLs by mouth 4 (four) times daily as needed. Mixed with 5 mL of viscous lidocaine for swish and swallow. 08/20/15   Sable Feil, PA-C  budesonide-formoterol [facility]) 80-4.5 MCG/ACT inhaler Inhale 2 puffs into the lungs 2 (two) times daily as needed (shortness of breath).    Historical Provider, MD  cyclobenzaprine (FLEXERIL) 10 MG tablet Take 1 tablet (10 mg total) by mouth every 8 (eight) hours as needed for muscle spasms. 08/20/15   Sable Feil, PA-C  cyclobenzaprine (FLEXERIL) 5 MG tablet Take 1 tablet (5 mg total) by mouth 3  (three) times daily as needed for muscle spasms. 04/11/15   Quintella Reichert, MD  DULoxetine (CYMBALTA) 20 MG capsule Take 20 mg by mouth daily.    Historical Provider, MD  ferrous gluconate (FERGON) 324 MG tablet Take 324 mg by mouth daily with breakfast.    Historical Provider, MD  HYDROcodone-acetaminophen (NORCO/VICODIN) 5-325 MG per tablet Take 1 tablet by mouth every 6 (six) hours as needed. 04/11/15   Quintella Reichert, MD  levothyroxine (SYNTHROID, LEVOTHROID) 125 MCG tablet Take 125 mcg by mouth daily before breakfast.    Historical Provider, MD  lidocaine (XYLOCAINE) 2 % solution Use as directed 5 mLs in the mouth or throat every 6 (six) hours as needed for mouth pain. Mixed with 5 mL Bromfed-DM for swish and swallow. 08/20/15   Sable Feil, PA-C  metroNIDAZOLE (FLAGYL) 500 MG tablet Take 1 tablet (500 mg total) by mouth 2 (two) times daily. 04/11/15   Quintella Reichert, MD  Multiple Vitamin (MULTIVITAMIN WITH MINERALS) TABS tablet Take 2 tablets by mouth daily.    Historical Provider, MD  ondansetron (ZOFRAN ODT) 4 MG disintegrating tablet Take 1 tablet (4 mg total) by mouth every 8 (eight) hours as needed for nausea or vomiting. 04/18/16   Carrie Mew, MD  oxyCODONE-acetaminophen (ROXICET) 5-325 MG tablet Take 1 tablet by mouth every 6 (six) hours as needed for severe pain. 04/18/16   Carrie Mew, MD  promethazine (PHENERGAN) 25 MG tablet Take 1 tablet (25 mg total) by  mouth every 6 (six) hours as needed for nausea or vomiting. 04/21/16   Lavonia Drafts, MD  vitamin B-12 (CYANOCOBALAMIN) 1000 MCG tablet Take 1,000 mcg by mouth daily.    Historical Provider, MD  Vitamin D, Ergocalciferol, (DRISDOL) 50000 UNITS CAPS capsule Take 50,000 Units by mouth every 7 (seven) days. On Wednesday.    Historical Provider, MD     Allergies Ibuprofen and Nsaids  No family history on file.  Social History Social History  Substance Use Topics  . Smoking status: Current Every Day Smoker    Packs/day:  1.00    Types: Cigarettes  . Smokeless tobacco: Never Used  . Alcohol use Yes    Review of Systems  Constitutional: No fever/chills Eyes: No visual changes.  ENT: No sore throat. Cardiovascular: Denies chest pain. Respiratory: Denies shortness of breath.No cough Gastrointestinal: As above   Genitourinary: Negative for dysuria. Musculoskeletal: Negative for back pain. Mild body aches Skin: Negative for rash. Neurological: Negative for headaches   10-point ROS otherwise negative.  ____________________________________________   PHYSICAL EXAM:  VITAL SIGNS: ED Triage Vitals  Enc Vitals Group     BP 04/21/16 1154 107/66     Pulse Rate 04/21/16 1154 79     Resp 04/21/16 1154 17     Temp 04/21/16 1154 98.1 F (36.7 C)     Temp Source 04/21/16 1154 Oral     SpO2 04/21/16 1154 100 %     Weight 04/21/16 1155 160 lb (72.6 kg)     Height 04/21/16 1155 5\' 3"  (1.6 m)     Head Circumference --      Peak Flow --      Pain Score 04/21/16 1155 8     Pain Loc --      Pain Edu? --      Excl. in [place]? --     Constitutional: Alert and oriented. No acute distress. Pleasant and interactive Eyes: Conjunctivae are normal.  Head: Atraumatic. Nose: No congestion/rhinnorhea. Mouth/Throat: Mucous membranes are moist.   Neck:  Painless ROM Cardiovascular: Normal rate, regular rhythm. Grossly normal heart sounds.  Good peripheral circulation. Respiratory: Normal respiratory effort.  No retractions. Lungs CTAB. Gastrointestinal: Soft and nontender. No distention.  No CVA tenderness. Genitourinary: deferred Musculoskeletal:   Warm and well perfused Neurologic:  Normal speech and language. No gross focal neurologic deficits are appreciated.  Skin:  Skin is warm, dry and intact. No rash noted. Psychiatric: Mood and affect are normal. Speech and behavior are normal.  ____________________________________________   LABS (all labs ordered are listed, but only abnormal results are  displayed)  Labs Reviewed  CBC - Abnormal; Notable for the following:       Result Value   RDW 19.7 (*)    All other components within normal limits  LIPASE, BLOOD  COMPREHENSIVE METABOLIC PANEL   ____________________________________________  EKG  None ____________________________________________  RADIOLOGY  CT scan from 2 days ago reviewed and is normal ____________________________________________   PROCEDURES  Procedure(s) performed: No    Critical Care performed: No ____________________________________________   INITIAL IMPRESSION / ASSESSMENT AND PLAN / ED COURSE  Pertinent labs & imaging results that were available during my care of the patient were reviewed by me and considered in my medical decision making (see chart for details).  Patient well-appearing and in no distress. Vital signs normal. Repeat Lab work is reassuring. She admits that she feels like she is starting to get better but requests affordable nausea medication. We will prescribe Phenergan and  have her follow-up with her PCP.  Clinical Course  Value Comment By Time  Creatinine: 0.49 (Reviewed) Lavonia Drafts, MD 08/21 1321   ____________________________________________   FINAL CLINICAL IMPRESSION(S) / ED DIAGNOSES  Final diagnoses:  Gastroenteritis      NEW MEDICATIONS STARTED DURING THIS VISIT:  New Prescriptions   PROMETHAZINE (PHENERGAN) 25 MG TABLET    Take 1 tablet (25 mg total) by mouth every 6 (six) hours as needed for nausea or vomiting.     Note:  This document was prepared using Dragon voice recognition software and may include unintentional dictation errors.    Lavonia Drafts, MD 04/21/16 1332

## 2016-04-21 NOTE — ED Triage Notes (Signed)
Pt c/o generalized abd pain with N/V/D since last Tuesday and was seen here for the same on Friday , states she can not afford the $70 for zofran.Marland Kitchen

## 2016-06-19 DIAGNOSIS — F3175 Bipolar disorder, in partial remission, most recent episode depressed: Secondary | ICD-10-CM | POA: Insufficient documentation

## 2016-06-19 DIAGNOSIS — E559 Vitamin D deficiency, unspecified: Secondary | ICD-10-CM | POA: Insufficient documentation

## 2016-06-19 DIAGNOSIS — Z9884 Bariatric surgery status: Secondary | ICD-10-CM | POA: Insufficient documentation

## 2016-06-19 DIAGNOSIS — F902 Attention-deficit hyperactivity disorder, combined type: Secondary | ICD-10-CM | POA: Insufficient documentation

## 2016-11-08 ENCOUNTER — Encounter: Payer: Self-pay | Admitting: Emergency Medicine

## 2016-11-08 ENCOUNTER — Emergency Department
Admission: EM | Admit: 2016-11-08 | Discharge: 2016-11-08 | Disposition: A | Discharge status: Home / Self Care | Payer: PRIVATE HEALTH INSURANCE | Attending: Emergency Medicine | Admitting: Emergency Medicine

## 2016-11-08 DIAGNOSIS — F1520 Other stimulant dependence, uncomplicated: Secondary | ICD-10-CM | POA: Diagnosis not present

## 2016-11-08 DIAGNOSIS — J040 Acute laryngitis: Secondary | ICD-10-CM | POA: Diagnosis not present

## 2016-11-08 DIAGNOSIS — F1590 Other stimulant use, unspecified, uncomplicated: Secondary | ICD-10-CM

## 2016-11-08 DIAGNOSIS — E876 Hypokalemia: Secondary | ICD-10-CM | POA: Diagnosis not present

## 2016-11-08 DIAGNOSIS — R531 Weakness: Secondary | ICD-10-CM | POA: Diagnosis present

## 2016-11-08 DIAGNOSIS — F17210 Nicotine dependence, cigarettes, uncomplicated: Secondary | ICD-10-CM | POA: Insufficient documentation

## 2016-11-08 DIAGNOSIS — Z79891 Long term (current) use of opiate analgesic: Secondary | ICD-10-CM | POA: Diagnosis not present

## 2016-11-08 DIAGNOSIS — Z79899 Other long term (current) drug therapy: Secondary | ICD-10-CM | POA: Insufficient documentation

## 2016-11-08 DIAGNOSIS — F1510 Other stimulant abuse, uncomplicated: Secondary | ICD-10-CM

## 2016-11-08 DIAGNOSIS — F1190 Opioid use, unspecified, uncomplicated: Secondary | ICD-10-CM

## 2016-11-08 LAB — CBC WITH DIFFERENTIAL/PLATELET
Basophils Absolute: 0 10*3/uL (ref 0–0.1)
Basophils Relative: 1 %
Eosinophils Absolute: 0.6 10*3/uL (ref 0–0.7)
Eosinophils Relative: 10 %
HCT: 33.1 % — ABNORMAL LOW (ref 35.0–47.0)
Hemoglobin: 10.6 g/dL — ABNORMAL LOW (ref 12.0–16.0)
Lymphocytes Relative: 39 %
Lymphs Abs: 2.3 10*3/uL (ref 1.0–3.6)
MCH: 24.6 pg — ABNORMAL LOW (ref 26.0–34.0)
MCHC: 32 g/dL (ref 32.0–36.0)
MCV: 76.8 fL — ABNORMAL LOW (ref 80.0–100.0)
Monocytes Absolute: 0.3 10*3/uL (ref 0.2–0.9)
Monocytes Relative: 6 %
Neutro Abs: 2.6 10*3/uL (ref 1.4–6.5)
Neutrophils Relative %: 44 %
Platelets: 256 10*3/uL (ref 150–440)
RBC: 4.31 MIL/uL (ref 3.80–5.20)
RDW: 16.8 % — ABNORMAL HIGH (ref 11.5–14.5)
WBC: 5.9 10*3/uL (ref 3.6–11.0)

## 2016-11-08 LAB — COMPREHENSIVE METABOLIC PANEL
ALT: 16 U/L (ref 14–54)
AST: 27 U/L (ref 15–41)
Albumin: 3.8 g/dL (ref 3.5–5.0)
Alkaline Phosphatase: 72 U/L (ref 38–126)
Anion gap: 8 (ref 5–15)
BUN: 5 mg/dL — ABNORMAL LOW (ref 6–20)
CO2: 25 mmol/L (ref 22–32)
Calcium: 8 mg/dL — ABNORMAL LOW (ref 8.9–10.3)
Chloride: 108 mmol/L (ref 101–111)
Creatinine, Ser: 0.38 mg/dL — ABNORMAL LOW (ref 0.44–1.00)
GFR calc Af Amer: >60 mL/min (ref >60)
GFR calc non Af Amer: >60 mL/min (ref >60)
Glucose, Bld: 115 mg/dL — ABNORMAL HIGH (ref 65–99)
Potassium: 3 mmol/L — ABNORMAL LOW (ref 3.5–5.1)
Sodium: 141 mmol/L (ref 135–145)
Total Bilirubin: 0.4 mg/dL (ref 0.3–1.2)
Total Protein: 7.1 g/dL (ref 6.5–8.1)

## 2016-11-08 LAB — URINE DRUG SCREEN, QUALITATIVE (ARMC ONLY)
Amphetamines, Ur Screen: POSITIVE — AB
Barbiturates, Ur Screen: NOT DETECTED
Benzodiazepine, Ur Scrn: NOT DETECTED
Cannabinoid 50 Ng, Ur ~~LOC~~: NOT DETECTED
Cocaine Metabolite,Ur ~~LOC~~: NOT DETECTED
MDMA (Ecstasy)Ur Screen: NOT DETECTED
Methadone Scn, Ur: NOT DETECTED
Opiate, Ur Screen: POSITIVE — AB
Phencyclidine (PCP) Ur S: NOT DETECTED
Tricyclic, Ur Screen: NOT DETECTED

## 2016-11-08 LAB — URINALYSIS, COMPLETE (UACMP) WITH MICROSCOPIC
Bacteria, UA: NONE SEEN
Bilirubin Urine: NEGATIVE
Glucose, UA: NEGATIVE mg/dL
Hgb urine dipstick: NEGATIVE
Ketones, ur: NEGATIVE mg/dL
Leukocytes, UA: NEGATIVE
Nitrite: NEGATIVE
Protein, ur: NEGATIVE mg/dL
RBC / HPF: NONE SEEN RBC/hpf (ref 0–5)
Specific Gravity, Urine: 1.003 — ABNORMAL LOW (ref 1.005–1.030)
pH: 6 (ref 5.0–8.0)

## 2016-11-08 LAB — GLUCOSE, CAPILLARY: Glucose-Capillary: 116 mg/dL — ABNORMAL HIGH (ref 65–99)

## 2016-11-08 LAB — TROPONIN I: Troponin I: <0.03 ng/mL (ref <0.03)

## 2016-11-08 MED ORDER — SODIUM CHLORIDE 0.9 % IV BOLUS (SEPSIS)
1000.0000 mL | Freq: Once | INTRAVENOUS | Status: AC
  Administered 2016-11-08: 1000 mL via INTRAVENOUS

## 2016-11-08 NOTE — ED Triage Notes (Signed)
Pt to ED with c/o of chest and head pressure. Pt states she has been "really sleepy and unable to stay awake". Pt states nausea and diarrhea but no vomiting. Pt denies throat pain but having difficulty talking.

## 2016-11-08 NOTE — ED Provider Notes (Signed)
[facility] Emergency Department Provider Note  ____________________________________________   First MD Initiated Contact with Patient 11/08/16 1609     (approximate)  I have reviewed the triage vital signs and the nursing notes.   HISTORY  Chief Complaint Weakness   HPI Kelsey Donovan is a 26 y.o. female who presents to the emergency department for evaluation of weakness, chest congestion, head pressure, and being unable to stay awake. Symptoms started last night. She states her voice has been "coming and going" today, but denies sore throat. She denies fever, nausea, or vomiting, she does endorse a few episodes of diarrhea. She has not taken any alleviating measures for these complaints.    Past Medical History:  Diagnosis Date  . Renal disorder    kidney stones  . Thyroid disease     There are no active problems to display for this patient.   Past Surgical History:  Procedure Laterality Date  . gastric bybpass    . TONSILLECTOMY      Prior to Admission medications   Medication Sig Start Date End Date Taking? Authorizing Provider  acetaminophen (TYLENOL) 500 MG tablet Take 1,000 mg by mouth every 6 (six) hours as needed for moderate pain.    Historical Provider, MD  amphetamine-dextroamphetamine (ADDERALL) 30 MG tablet Take 30 mg by mouth 2 (two) times daily.    Historical Provider, MD  brompheniramine-pseudoephedrine-DM 30-2-10 MG/5ML syrup Take 5 mLs by mouth 4 (four) times daily as needed. Mixed with 5 mL of viscous lidocaine for swish and swallow. 08/20/15   Sable Feil, PA-C  budesonide-formoterol [facility]) 80-4.5 MCG/ACT inhaler Inhale 2 puffs into the lungs 2 (two) times daily as needed (shortness of breath).    Historical Provider, MD  cyclobenzaprine (FLEXERIL) 10 MG tablet Take 1 tablet (10 mg total) by mouth every 8 (eight) hours as needed for muscle spasms. 08/20/15   Sable Feil, PA-C  cyclobenzaprine (FLEXERIL) 5 MG tablet  Take 1 tablet (5 mg total) by mouth 3 (three) times daily as needed for muscle spasms. 04/11/15   Quintella Reichert, MD  DULoxetine (CYMBALTA) 20 MG capsule Take 20 mg by mouth daily.    Historical Provider, MD  ferrous gluconate (FERGON) 324 MG tablet Take 324 mg by mouth daily with breakfast.    Historical Provider, MD  HYDROcodone-acetaminophen (NORCO/VICODIN) 5-325 MG per tablet Take 1 tablet by mouth every 6 (six) hours as needed. 04/11/15   Quintella Reichert, MD  levothyroxine (SYNTHROID, LEVOTHROID) 125 MCG tablet Take 125 mcg by mouth daily before breakfast.    Historical Provider, MD  lidocaine (XYLOCAINE) 2 % solution Use as directed 5 mLs in the mouth or throat every 6 (six) hours as needed for mouth pain. Mixed with 5 mL Bromfed-DM for swish and swallow. 08/20/15   Sable Feil, PA-C  metroNIDAZOLE (FLAGYL) 500 MG tablet Take 1 tablet (500 mg total) by mouth 2 (two) times daily. 04/11/15   Quintella Reichert, MD  Multiple Vitamin (MULTIVITAMIN WITH MINERALS) TABS tablet Take 2 tablets by mouth daily.    Historical Provider, MD  ondansetron (ZOFRAN ODT) 4 MG disintegrating tablet Take 1 tablet (4 mg total) by mouth every 8 (eight) hours as needed for nausea or vomiting. 04/18/16   Carrie Mew, MD  oxyCODONE-acetaminophen (ROXICET) 5-325 MG tablet Take 1 tablet by mouth every 6 (six) hours as needed for severe pain. 04/18/16   Carrie Mew, MD  promethazine (PHENERGAN) 25 MG tablet Take 1 tablet (25 mg total) by  mouth every 6 (six) hours as needed for nausea or vomiting. 04/21/16   Lavonia Drafts, MD  vitamin B-12 (CYANOCOBALAMIN) 1000 MCG tablet Take 1,000 mcg by mouth daily.    Historical Provider, MD  Vitamin D, Ergocalciferol, (DRISDOL) 50000 UNITS CAPS capsule Take 50,000 Units by mouth every 7 (seven) days. On Wednesday.    Historical Provider, MD    Allergies Ibuprofen and Nsaids  History reviewed. No pertinent family history.  Social History Social History  Substance Use Topics  .  Smoking status: Current Every Day Smoker    Packs/day: 1.00    Types: Cigarettes  . Smokeless tobacco: Never Used  . Alcohol use Yes    Review of Systems Constitutional: No fever/chills Eyes: No visual changes. ENT: No sore throat. Positive for hoarseness Cardiovascular: Denies chest pain. Respiratory: Denies shortness of breath.  Gastrointestinal: No abdominal pain.  No nausea, no vomiting. Positive for diarrhea. Genitourinary: Negative for dysuria. Musculoskeletal: Negative for bodyaches Skin: Negative for rash. Neurological: Negative for headaches, focal weakness or numbness. ___________________________________________   PHYSICAL EXAM:  VITAL SIGNS: ED Triage Vitals  Enc Vitals Group     BP 11/08/16 1605 123/74     Pulse Rate 11/08/16 1605 (!) 105     Resp 11/08/16 1605 18     Temp 11/08/16 1605 97.7 F (36.5 C)     Temp Source 11/08/16 1605 Oral     SpO2 11/08/16 1605 97 %     Weight 11/08/16 1610 160 lb (72.6 kg)     Height 11/08/16 1610 5\' 2"  (1.575 m)     Head Circumference --      Peak Flow --      Pain Score --      Pain Loc --      Pain Edu? --      Excl. in [place]? --     Constitutional: Alert and oriented. Acutely ill appearing and in no acute distress. Eyes: Conjunctivae are normal. EOMI. Head: Atraumatic. Nose: No congestion/rhinnorhea. Mouth/Throat: Mucous membranes are moist.  Oropharynx non-erythematous. Neck: No stridor.   Cardiovascular: Slightly tachycardic, regular rhythm. Grossly normal heart sounds.  Good peripheral circulation. Respiratory: Normal respiratory effort.  No retractions. Lungs CTAB. Gastrointestinal: Soft and nontender. No distention.  Musculoskeletal: No lower extremity tenderness nor edema.  No joint effusions. Neurologic:  Normal speech and language. No gross focal neurologic deficits are appreciated. Skin:  Skin is warm, dry and intact. No rash noted. Psychiatric: Mood and affect are normal. Speech is normal.    ____________________________________________   LABS (all labs ordered are listed, but only abnormal results are displayed)  Labs Reviewed  COMPREHENSIVE METABOLIC PANEL - Abnormal; Notable for the following:       Result Value   Potassium 3.0 (*)    Glucose, Bld 115 (*)    BUN 5 (*)    Creatinine, Ser 0.38 (*)    Calcium 8.0 (*)    All other components within normal limits  CBC WITH DIFFERENTIAL/PLATELET - Abnormal; Notable for the following:    Hemoglobin 10.6 (*)    HCT 33.1 (*)    MCV 76.8 (*)    MCH 24.6 (*)    RDW 16.8 (*)    All other components within normal limits  URINALYSIS, COMPLETE (UACMP) WITH MICROSCOPIC - Abnormal; Notable for the following:    Color, Urine STRAW (*)    APPearance CLEAR (*)    Specific Gravity, Urine 1.003 (*)    Squamous Epithelial / LPF 0-5 (*)  All other components within normal limits  URINE DRUG SCREEN, QUALITATIVE ([facility] ONLY) - Abnormal; Notable for the following:    Amphetamines, Ur Screen POSITIVE (*)    Opiate, Ur Screen POSITIVE (*)    All other components within normal limits  GLUCOSE, CAPILLARY - Abnormal; Notable for the following:    Glucose-Capillary 116 (*)    All other components within normal limits  TROPONIN I  CBG MONITORING, ED   ____________________________________________  EKG  Ventricular rate: 99 Rhythm: Normal Sinus ST/T: No abnormalities QRS Axis normal No ectopy No indication of acute ischemia ____________________________________________  RADIOLOGY  Not indicated. ____________________________________________   PROCEDURES  Procedure(s) performed: None  Procedures  Critical Care performed: No  ____________________________________________   INITIAL IMPRESSION / ASSESSMENT AND PLAN / ED COURSE  Pertinent labs & imaging results that were available during my care of the patient were reviewed by me and considered in my medical decision making (see chart for details).  26 year old female  presenting to the emergency department for multiple medical complaints. She is alert and oriented without any apparent neurological deficits. She gives vague details about her earlier statement that she is "unable to stay awake." She states that she sleeps for about 10 minutes at a time and then awakens on her own, but then falls back to sleep. She reports a few episodes of diarrhea but denies nausea or vomiting. Due to her multiple symptoms and widespread differential diagnosis, labs, urine, EKG, and urine drug screen will be performed.  1808 p.m.:  Patient noted to be mildly hypokalemic on review of CMP. 1 L of normal saline ordered.  1850 p.m.: Urinalysis shows presence of amphetamines and opiates. On initial interview, the patient had denied taking any prescription or over-the-counter medications. She was again asked if she had taken any medications and she responded that she had not. She was then advised that amphetamines and opiates were present in her urine. She then states that she is taking Adderall and that she has been taking Norco "for years." Patient was advised that other than the slight decrease in potassium and the presence of the 2 substances in her urine, all of her results with the exception of her urinalysis are back and do not appear concerning or related to her current symptoms. Plan is to await the urinalysis to ensure that there is no indication of infection and then discuss discharge plan.  1900 p.m.: RN advises that the patient has become upset and feels as if she has been accused of drug-seeking behavior, however, the conversation regarding the amphetamines and opiates was not intended to be accusatory but would help explain her sleepiness. Upon review of the [place] Controlled Substance Database, she appears compliant with monthly prescribing of Adderall, but has not had a prescription filled for Norco since May 2017. Patient did not want to await the results of her urinalysis and  requested immediate discharge. Discharge instructions will be printed and the patient will be encouraged to follow-up with her primary care provider for symptoms that are not improving over the next few days or for symptoms that change or worsen.     ____________________________________________   FINAL CLINICAL IMPRESSION(S) / ED DIAGNOSES  Final diagnoses:  Hypokalemia  Generalized weakness  Laryngitis  Opiate use  Amphetamine user      NEW MEDICATIONS STARTED DURING THIS VISIT:  Discharge Medication List as of 11/08/2016  7:05 PM       Note:  This document was prepared using Dragon voice recognition software  and may include unintentional dictation errors.    Victorino Dike, FNP 11/08/16 2255    Earleen Newport, MD 11/08/16 [phone]

## 2016-11-08 NOTE — Discharge Instructions (Signed)
Follow up with your primary care provider for symptoms that are not improving over the next few days.  Return to the ER for symptoms that change or worsen if unable to schedule an appointment. 

## 2016-11-08 NOTE — ED Notes (Signed)
NAD noted at time of D/C. Pt denies questions or concerns. Pt ambulatory to the lobby at this time.  

## 2016-11-08 NOTE — ED Notes (Signed)
Pt states that she is upset with the provider due to the provider asking her about the amphetamines and opiates in her urine. Pt states "I didn't ask her for pain meds, my Norcos work great for me and they wouldn't make me sleepy." Pt then goes on to state, "I feel like shit I just wanted something to help my throat and make me feel better". Pt ambulatory to the lobby at this time without her SO. Pt's SO then runs up and follows patient down the hall.

## 2016-11-08 NOTE — ED Provider Notes (Signed)
ED ECG REPORT I, Daymon Larsen, the attending physician, personally viewed and interpreted this ECG.  Date: 11/08/2016 EKG Time: *1634 Rate: *99 Rhythm: normal sinus rhythm QRS Axis: normal Intervals: normal ST/T Wave abnormalities: normal Conduction Disturbances: none Narrative Interpretation: unremarkable Poor R-wave progression in the anterior leads No acute ischemic changes     Daymon Larsen, MD 11/08/16 1641

## 2016-11-08 NOTE — ED Notes (Signed)
UA collected and sent to lab.

## 2016-12-09 DIAGNOSIS — J302 Other seasonal allergic rhinitis: Secondary | ICD-10-CM | POA: Insufficient documentation

## 2016-12-17 ENCOUNTER — Encounter: Payer: Self-pay | Admitting: Emergency Medicine

## 2016-12-17 ENCOUNTER — Emergency Department
Admission: EM | Admit: 2016-12-17 | Discharge: 2016-12-17 | Disposition: A | Discharge status: Home / Self Care | Payer: PRIVATE HEALTH INSURANCE | Attending: Emergency Medicine | Admitting: Emergency Medicine

## 2016-12-17 DIAGNOSIS — R569 Unspecified convulsions: Secondary | ICD-10-CM

## 2016-12-17 DIAGNOSIS — F17210 Nicotine dependence, cigarettes, uncomplicated: Secondary | ICD-10-CM | POA: Insufficient documentation

## 2016-12-17 DIAGNOSIS — Z79899 Other long term (current) drug therapy: Secondary | ICD-10-CM | POA: Insufficient documentation

## 2016-12-17 DIAGNOSIS — G40909 Epilepsy, unspecified, not intractable, without status epilepticus: Secondary | ICD-10-CM | POA: Insufficient documentation

## 2016-12-17 HISTORY — DX: Neoplasm of unspecified behavior of endocrine glands and other parts of nervous system: D49.7

## 2016-12-17 LAB — ETHANOL: Alcohol, Ethyl (B): <5 mg/dL (ref <5)

## 2016-12-17 LAB — URINE DRUG SCREEN, QUALITATIVE (ARMC ONLY)
Amphetamines, Ur Screen: POSITIVE — AB
Barbiturates, Ur Screen: NOT DETECTED
Benzodiazepine, Ur Scrn: NOT DETECTED
Cannabinoid 50 Ng, Ur ~~LOC~~: NOT DETECTED
Cocaine Metabolite,Ur ~~LOC~~: NOT DETECTED
MDMA (Ecstasy)Ur Screen: NOT DETECTED
Methadone Scn, Ur: NOT DETECTED
Opiate, Ur Screen: NOT DETECTED
Phencyclidine (PCP) Ur S: NOT DETECTED
Tricyclic, Ur Screen: NOT DETECTED

## 2016-12-17 LAB — URINALYSIS, ROUTINE W REFLEX MICROSCOPIC
Bacteria, UA: NONE SEEN
Bilirubin Urine: NEGATIVE
Glucose, UA: NEGATIVE mg/dL
Hgb urine dipstick: NEGATIVE
Ketones, ur: NEGATIVE mg/dL
Leukocytes, UA: NEGATIVE
Nitrite: NEGATIVE
Protein, ur: 30 mg/dL — AB
RBC / HPF: NONE SEEN RBC/hpf (ref 0–5)
Specific Gravity, Urine: 1.019 (ref 1.005–1.030)
pH: 6 (ref 5.0–8.0)

## 2016-12-17 LAB — COMPREHENSIVE METABOLIC PANEL
ALT: 12 U/L — ABNORMAL LOW (ref 14–54)
AST: 33 U/L (ref 15–41)
Albumin: 3.9 g/dL (ref 3.5–5.0)
Alkaline Phosphatase: 80 U/L (ref 38–126)
Anion gap: 9 (ref 5–15)
BUN: 6 mg/dL (ref 6–20)
CO2: 21 mmol/L — ABNORMAL LOW (ref 22–32)
Calcium: 8.9 mg/dL (ref 8.9–10.3)
Chloride: 106 mmol/L (ref 101–111)
Creatinine, Ser: 0.44 mg/dL (ref 0.44–1.00)
GFR calc Af Amer: >60 mL/min (ref >60)
GFR calc non Af Amer: >60 mL/min (ref >60)
Glucose, Bld: 85 mg/dL (ref 65–99)
Potassium: 3 mmol/L — ABNORMAL LOW (ref 3.5–5.1)
Sodium: 136 mmol/L (ref 135–145)
Total Bilirubin: 0.6 mg/dL (ref 0.3–1.2)
Total Protein: 7.2 g/dL (ref 6.5–8.1)

## 2016-12-17 LAB — CBC
HCT: 33.5 % — ABNORMAL LOW (ref 35.0–47.0)
Hemoglobin: 10.5 g/dL — ABNORMAL LOW (ref 12.0–16.0)
MCH: 24.1 pg — ABNORMAL LOW (ref 26.0–34.0)
MCHC: 31.4 g/dL — ABNORMAL LOW (ref 32.0–36.0)
MCV: 77 fL — ABNORMAL LOW (ref 80.0–100.0)
Platelets: 272 10*3/uL (ref 150–440)
RBC: 4.35 MIL/uL (ref 3.80–5.20)
RDW: 18.3 % — ABNORMAL HIGH (ref 11.5–14.5)
WBC: 10.7 10*3/uL (ref 3.6–11.0)

## 2016-12-17 LAB — POCT PREGNANCY, URINE: Preg Test, Ur: NEGATIVE

## 2016-12-17 MED ORDER — SODIUM CHLORIDE 0.9 % IV BOLUS (SEPSIS)
1000.0000 mL | Freq: Once | INTRAVENOUS | Status: AC
  Administered 2016-12-17: 1000 mL via INTRAVENOUS

## 2016-12-17 MED ORDER — LEVETIRACETAM 500 MG PO TABS: 500.0000 mg | ORAL_TABLET | Freq: Two times a day (BID) | ORAL | 2 refills | Status: DC

## 2016-12-17 MED ORDER — LEVETIRACETAM 500 MG PO TABS
1000.0000 mg | ORAL_TABLET | Freq: Once | ORAL | Status: AC
  Administered 2016-12-17: 1000 mg via ORAL
  Filled 2016-12-17: qty 2

## 2016-12-17 NOTE — ED Triage Notes (Signed)
Patient from store via [place]. Family reports patient had approximately 2 minutes of seizure-like activity. Patient has no memory of episode. Patient has small laceration to right side of head and reports she bit her tongue. Patient states she had a seizure in 2014 that was possibly caused by pituitary tumor. Upon arrival patient is A&O x4.

## 2016-12-17 NOTE — ED Provider Notes (Signed)
[facility] Emergency Department Provider Note  Time seen: 8:12 PM  I have reviewed the triage vital signs and the nursing notes.   HISTORY  Chief Complaint Seizures    HPI Kelsey KROGSTAD is a 26 y.o. female with a past medical history of pituitary tumor who presents the emergency department after a seizure. According to the patient she was with family tonight shopping, states that next thing she remembers is waking up on the ground with lots of people around her and her child crying. Per report patient had approximate 2 minutes of tonic-clonic activity. Patient states in 2014 she had a similar event states she was diagnosed with a pituitary tumor, but they thought this to be benign and likely noncontributory to the seizure. Patient denies any alcohol use or drug use. Denies any tramadol/Ultram use. Patient's only complaint is of an abrasion to her tongue and feeling somewhat fatigued.  Past Medical History:  Diagnosis Date  . Pituitary tumor   . Renal disorder    kidney stones  . Thyroid disease     There are no active problems to display for this patient.   Past Surgical History:  Procedure Laterality Date  . gastric bybpass    . TONSILLECTOMY      Prior to Admission medications   Medication Sig Start Date End Date Taking? Authorizing Provider  acetaminophen (TYLENOL) 500 MG tablet Take 1,000 mg by mouth every 6 (six) hours as needed for moderate pain.    Historical Provider, MD  amphetamine-dextroamphetamine (ADDERALL) 30 MG tablet Take 30 mg by mouth 2 (two) times daily.    Historical Provider, MD  brompheniramine-pseudoephedrine-DM 30-2-10 MG/5ML syrup Take 5 mLs by mouth 4 (four) times daily as needed. Mixed with 5 mL of viscous lidocaine for swish and swallow. 08/20/15   Sable Feil, PA-C  budesonide-formoterol [facility]) 80-4.5 MCG/ACT inhaler Inhale 2 puffs into the lungs 2 (two) times daily as needed (shortness of breath).    Historical  Provider, MD  cyclobenzaprine (FLEXERIL) 10 MG tablet Take 1 tablet (10 mg total) by mouth every 8 (eight) hours as needed for muscle spasms. 08/20/15   Sable Feil, PA-C  cyclobenzaprine (FLEXERIL) 5 MG tablet Take 1 tablet (5 mg total) by mouth 3 (three) times daily as needed for muscle spasms. 04/11/15   Quintella Reichert, MD  DULoxetine (CYMBALTA) 20 MG capsule Take 20 mg by mouth daily.    Historical Provider, MD  ferrous gluconate (FERGON) 324 MG tablet Take 324 mg by mouth daily with breakfast.    Historical Provider, MD  HYDROcodone-acetaminophen (NORCO/VICODIN) 5-325 MG per tablet Take 1 tablet by mouth every 6 (six) hours as needed. 04/11/15   Quintella Reichert, MD  levothyroxine (SYNTHROID, LEVOTHROID) 125 MCG tablet Take 125 mcg by mouth daily before breakfast.    Historical Provider, MD  lidocaine (XYLOCAINE) 2 % solution Use as directed 5 mLs in the mouth or throat every 6 (six) hours as needed for mouth pain. Mixed with 5 mL Bromfed-DM for swish and swallow. 08/20/15   Sable Feil, PA-C  metroNIDAZOLE (FLAGYL) 500 MG tablet Take 1 tablet (500 mg total) by mouth 2 (two) times daily. 04/11/15   Quintella Reichert, MD  Multiple Vitamin (MULTIVITAMIN WITH MINERALS) TABS tablet Take 2 tablets by mouth daily.    Historical Provider, MD  ondansetron (ZOFRAN ODT) 4 MG disintegrating tablet Take 1 tablet (4 mg total) by mouth every 8 (eight) hours as needed for nausea or vomiting. 04/18/16  Carrie Mew, MD  oxyCODONE-acetaminophen (ROXICET) 5-325 MG tablet Take 1 tablet by mouth every 6 (six) hours as needed for severe pain. 04/18/16   Carrie Mew, MD  promethazine (PHENERGAN) 25 MG tablet Take 1 tablet (25 mg total) by mouth every 6 (six) hours as needed for nausea or vomiting. 04/21/16   Lavonia Drafts, MD  vitamin B-12 (CYANOCOBALAMIN) 1000 MCG tablet Take 1,000 mcg by mouth daily.    Historical Provider, MD  Vitamin D, Ergocalciferol, (DRISDOL) 50000 UNITS CAPS capsule Take 50,000 Units by  mouth every 7 (seven) days. On Wednesday.    Historical Provider, MD    Allergies  Allergen Reactions  . Ibuprofen     Pt told not to take Ibuprofen based pain medications due to having gastric bypass and "increased risk of thinning my blood".   . Nsaids     No family history on file.  Social History Social History  Substance Use Topics  . Smoking status: Current Every Day Smoker    Packs/day: 0.50    Types: Cigarettes  . Smokeless tobacco: Never Used  . Alcohol use Yes     Comment: occasional    Review of Systems Constitutional: Negative for fever.Positive for fatigue. Cardiovascular: Negative for chest pain. Respiratory: Negative for shortness of breath. Gastrointestinal: Negative for abdominal pain Neurological: Negative for headache 10-point ROS otherwise negative.  ____________________________________________   PHYSICAL EXAM:  VITAL SIGNS: ED Triage Vitals  Enc Vitals Group     BP 12/17/16 2000 131/81     Pulse Rate 12/17/16 2000 (!) 103     Resp 12/17/16 2000 20     Temp 12/17/16 2000 98.4 F (36.9 C)     Temp Source 12/17/16 2000 Oral     SpO2 12/17/16 2000 100 %     Weight 12/17/16 1954 173 lb (78.5 kg)     Height 12/17/16 1954 5\' 2"  (1.575 m)     Head Circumference --      Peak Flow --      Pain Score 12/17/16 1954 10     Pain Loc --      Pain Edu? --      Excl. in [place]? --     Constitutional: Alert and oriented. Well appearing and in no distress. Eyes: Normal exam ENT   Head: Normocephalic and atraumatic   Mouth/Throat: Mucous membranes are moist.Tongue abrasion bilaterally. Cardiovascular: Normal rate, regular rhythm. No murmur Respiratory: Normal respiratory effort without tachypnea nor retractions. Breath sounds are clear  Gastrointestinal: Soft and nontender. No distention.   Musculoskeletal: Nontender with normal range of motion in all extremities.  Neurologic:  Normal speech and language. No gross focal neurologic deficits are  appreciated. Skin:  Skin is warm, dry and intact.  Psychiatric: Mood and affect are normal. Speech and behavior are normal.   ____________________________________________     INITIAL IMPRESSION / ASSESSMENT AND PLAN / ED COURSE  Pertinent labs & imaging results that were available during my care of the patient were reviewed by me and considered in my medical decision making (see chart for details).  The patient presents to the emergency department after a likely tonic-clonic seizure. Patient has a tongue abrasion, states a feeling of fatigue. This is the patient's second seizure the first of which occurred in 2014. I discussed the patient with neurology. We will load the patient with 1000 mg of Keppra and start the patient on Keppra 500 mg twice a day and have her follow-up with neurology. We will check labs  IV hydrate and continue to monitor closely in the emergency department while awaiting results.   EKG reviewed and interpreted by myself shows sinus tachycardia at 100 bpm, narrow QRS, normal axis, normal intervals, no concerning ST changes.  Patient's labs are largely negative. We will discharge the patient with Keppra 500 mg twice a day with follow-up with neurology. Patient agreeable to this plan. I discussed precautions with the patient as far as not driving, swimming, bathing alone, etc.      ____________________________________________   FINAL CLINICAL IMPRESSION(S) / ED DIAGNOSES  Seizure    Harvest Dark, MD 12/17/16 2243

## 2016-12-31 ENCOUNTER — Other Ambulatory Visit: Payer: Self-pay | Admitting: Neurology

## 2016-12-31 DIAGNOSIS — D352 Benign neoplasm of pituitary gland: Secondary | ICD-10-CM

## 2016-12-31 DIAGNOSIS — R569 Unspecified convulsions: Secondary | ICD-10-CM

## 2017-01-20 ENCOUNTER — Ambulatory Visit
Admission: RE | Admit: 2017-01-20 | Discharge: 2017-01-20 | Disposition: A | Discharge status: Home / Self Care | Payer: PRIVATE HEALTH INSURANCE | Source: Ambulatory Visit | Attending: Neurology | Admitting: Neurology

## 2017-01-20 DIAGNOSIS — R569 Unspecified convulsions: Secondary | ICD-10-CM | POA: Diagnosis present

## 2017-01-20 DIAGNOSIS — D352 Benign neoplasm of pituitary gland: Secondary | ICD-10-CM | POA: Insufficient documentation

## 2017-01-20 MED ORDER — GADOBENATE DIMEGLUMINE 529 MG/ML IV SOLN
10.0000 mL | Freq: Once | INTRAVENOUS | Status: AC | PRN
  Administered 2017-01-20: 10 mL via INTRAVENOUS

## 2017-02-01 ENCOUNTER — Emergency Department
Admission: EM | Admit: 2017-02-01 | Discharge: 2017-02-01 | Disposition: A | Discharge status: Home / Self Care | Payer: PRIVATE HEALTH INSURANCE | Attending: Emergency Medicine | Admitting: Emergency Medicine

## 2017-02-01 ENCOUNTER — Encounter: Payer: Self-pay | Admitting: Emergency Medicine

## 2017-02-01 DIAGNOSIS — B308 Other viral conjunctivitis: Secondary | ICD-10-CM | POA: Insufficient documentation

## 2017-02-01 DIAGNOSIS — F17210 Nicotine dependence, cigarettes, uncomplicated: Secondary | ICD-10-CM | POA: Diagnosis not present

## 2017-02-01 DIAGNOSIS — J029 Acute pharyngitis, unspecified: Secondary | ICD-10-CM | POA: Diagnosis present

## 2017-02-01 DIAGNOSIS — B309 Viral conjunctivitis, unspecified: Secondary | ICD-10-CM

## 2017-02-01 MED ORDER — SULFACETAMIDE SODIUM 10 % OP SOLN: 2.0000 [drp] | OPHTHALMIC | 0 refills | Status: AC

## 2017-02-01 MED ORDER — AMOXICILLIN 500 MG PO TABS: 500.0000 mg | ORAL_TABLET | Freq: Three times a day (TID) | ORAL | 0 refills | Status: DC

## 2017-02-01 MED ORDER — MAGIC MOUTHWASH W/LIDOCAINE: 10.0000 mL | Freq: Four times a day (QID) | ORAL | 0 refills | Status: DC | PRN

## 2017-02-01 NOTE — ED Triage Notes (Signed)
Pt in via POV with fever, cough, congestion since Thursday, pt reports daughter just diagnosed with strep throat.  Pt also complains of redness, swelling, itching/burning to left eye since last night; states she felt like something was in it, took contacts out, washed out eye but without any relief.  NAD noted at this time.

## 2017-02-05 NOTE — ED Provider Notes (Signed)
[facility] Emergency Department Provider Note  ____________________________________________  Time seen: Approximately 11:31 PM  I have reviewed the triage vital signs and the nursing notes.   HISTORY  Chief Complaint Sore Throat    HPI Kelsey Donovan is a 26 y.o. female who presents to the emergency department for evaluation of sore throat. She states that her daughter and several other family members have strep throat and are on antibiotics. She also states that her left eye started itching and burning last night. She removed her contact lens and rinsed her eye, but still feels like there is "sand" in her eye. She now has purulent drainage in the left eye.  Past Medical History:  Diagnosis Date  . Pituitary tumor   . Renal disorder    kidney stones  . Thyroid disease     There are no active problems to display for this patient.   Past Surgical History:  Procedure Laterality Date  . gastric bybpass    . TONSILLECTOMY      Prior to Admission medications   Medication Sig Start Date End Date Taking? Authorizing Provider  acetaminophen (TYLENOL) 500 MG tablet Take 1,000 mg by mouth every 6 (six) hours as needed for moderate pain.    [provider]  amoxicillin (AMOXIL) 500 MG tablet Take 1 tablet (500 mg total) by mouth 3 (three) times daily. 02/01/17   Zabdiel Dripps B, FNP  amphetamine-dextroamphetamine (ADDERALL) 30 MG tablet Take 30 mg by mouth 2 (two) times daily.    [provider]  brompheniramine-pseudoephedrine-DM 30-2-10 MG/5ML syrup Take 5 mLs by mouth 4 (four) times daily as needed. Mixed with 5 mL of viscous lidocaine for swish and swallow. 08/20/15   Sable Feil, PA-C  budesonide-formoterol [facility]) 80-4.5 MCG/ACT inhaler Inhale 2 puffs into the lungs 2 (two) times daily as needed (shortness of breath).    [provider]  cyclobenzaprine (FLEXERIL) 10 MG tablet Take 1 tablet (10 mg total) by mouth every 8  (eight) hours as needed for muscle spasms. 08/20/15   Sable Feil, PA-C  cyclobenzaprine (FLEXERIL) 5 MG tablet Take 1 tablet (5 mg total) by mouth 3 (three) times daily as needed for muscle spasms. 04/11/15   Quintella Reichert, MD  DULoxetine (CYMBALTA) 20 MG capsule Take 20 mg by mouth daily.    [provider]  ferrous gluconate (FERGON) 324 MG tablet Take 324 mg by mouth daily with breakfast.    [provider]  HYDROcodone-acetaminophen (NORCO/VICODIN) 5-325 MG per tablet Take 1 tablet by mouth every 6 (six) hours as needed. 04/11/15   Quintella Reichert, MD  levETIRAcetam (KEPPRA) 500 MG tablet Take 1 tablet (500 mg total) by mouth 2 (two) times daily. 12/17/16   Harvest Dark, MD  levothyroxine (SYNTHROID, LEVOTHROID) 125 MCG tablet Take 125 mcg by mouth daily before breakfast.    [provider]  lidocaine (XYLOCAINE) 2 % solution Use as directed 5 mLs in the mouth or throat every 6 (six) hours as needed for mouth pain. Mixed with 5 mL Bromfed-DM for swish and swallow. 08/20/15   Sable Feil, PA-C  magic mouthwash w/lidocaine SOLN Take 10 mLs by mouth 4 (four) times daily as needed for mouth pain. 02/01/17   Peterson Mathey B, FNP  metroNIDAZOLE (FLAGYL) 500 MG tablet Take 1 tablet (500 mg total) by mouth 2 (two) times daily. 04/11/15   Quintella Reichert, MD  Multiple Vitamin (MULTIVITAMIN WITH MINERALS) TABS tablet Take 2 tablets by mouth daily.  [provider]  ondansetron (ZOFRAN ODT) 4 MG disintegrating tablet Take 1 tablet (4 mg total) by mouth every 8 (eight) hours as needed for nausea or vomiting. 04/18/16   Carrie Mew, MD  oxyCODONE-acetaminophen (ROXICET) 5-325 MG tablet Take 1 tablet by mouth every 6 (six) hours as needed for severe pain. 04/18/16   Carrie Mew, MD  promethazine (PHENERGAN) 25 MG tablet Take 1 tablet (25 mg total) by mouth every 6 (six) hours as needed for nausea or vomiting. 04/21/16   Lavonia Drafts, MD  vitamin B-12  (CYANOCOBALAMIN) 1000 MCG tablet Take 1,000 mcg by mouth daily.    [provider]  Vitamin D, Ergocalciferol, (DRISDOL) 50000 UNITS CAPS capsule Take 50,000 Units by mouth every 7 (seven) days. On Wednesday.    [provider]    Allergies Ibuprofen and Nsaids  No family history on file.  Social History Social History  Substance Use Topics  . Smoking status: Current Every Day Smoker    Packs/day: 0.50    Types: Cigarettes  . Smokeless tobacco: Never Used  . Alcohol use Yes     Comment: occasional    Review of Systems Constitutional: Negative for fever. Eyes: No visual changes. Positive for conjunctival erythema and discharge of the left eye. ENT: Positive for sore throat; negative for difficulty swallowing. Respiratory: Denies shortness of breath. Gastrointestinal: No abdominal pain.  No nausea, no vomiting.  No diarrhea.  Genitourinary: Negative for dysuria. Musculoskeletal: Negative for generalized body aches. Skin: Negative for rash. Neurological: Negative for headaches, focal weakness or numbness.  ____________________________________________   PHYSICAL EXAM:  VITAL SIGNS: ED Triage Vitals  Enc Vitals Group     BP 02/01/17 1125 134/79     Pulse Rate 02/01/17 1125 (!) 119     Resp 02/01/17 1125 18     Temp 02/01/17 1125 98.7 F (37.1 C)     Temp Source 02/01/17 1125 Oral     SpO2 02/01/17 1125 100 %     Weight 02/01/17 1126 175 lb (79.4 kg)     Height 02/01/17 1126 5\' 2"  (1.575 m)     Head Circumference --      Peak Flow --      Pain Score 02/01/17 1125 0     Pain Loc --      Pain Edu? --      Excl. in [place]? --    Constitutional: Alert and oriented. Well appearing and in no acute distress. Eyes: Conjunctivae of the left eye is erythematous to the limbus with purulent drainage in the lower lid. Head: Atraumatic. Nose: No congestion/rhinnorhea. Mouth/Throat: Mucous membranes are moist.  Oropharynx Erythematous, tonsils are absent. No  exudate. Neck: No stridor.  Lymphatic: Lymphadenopathy: Biateral anterior cervical lymphadenopathy palpable Cardiovascular: Normal rate, regular rhythm. Good peripheral circulation. Respiratory: Normal respiratory effort. Lungs CTAB. Gastrointestinal: Soft and nontender. Musculoskeletal: No lower extremity tenderness nor edema.   Neurologic:  Normal speech and language. No gross focal neurologic deficits are appreciated. Speech is normal. No gait instability. Skin:  Skin is warm, dry and intact. No rash noted Psychiatric: Mood and affect are normal. Speech and behavior are normal.  ____________________________________________   LABS (all labs ordered are listed, but only abnormal results are displayed)  Labs Reviewed - No data to display ____________________________________________  EKG  Not indicated ____________________________________________  RADIOLOGY  Not indicated ____________________________________________   PROCEDURES  Procedure(s) performed: None  Critical Care performed: No ____________________________________________   INITIAL IMPRESSION / ASSESSMENT AND PLAN / ED COURSE  Pertinent labs & imaging results that were available during my care of the patient were reviewed by me and considered in my medical decision making (see chart for details).  26 year old female presenting to the emergency department for evaluation of drainage from the left eye and sore throat. She was treated with amoxicillin and magic mouthwash for the pharyngitis and given Bleph-10 for the conjunctivitis of the left eye. She was encouraged to wash her hands frequently and avoid using her contact lenses until she has finished with the antibiotics. She was instructed to throw away the contact lenses removed from her left eye yesterday. She was advised follow-up with the primary care provider for choice for symptoms that are not improving over the next few days. She was advised to return to the  emergency department for symptoms change or worsen if unable schedule an appointment. ____________________________________________  Discharge Medication List as of 02/01/2017 12:45 PM    START taking these medications   Details  amoxicillin (AMOXIL) 500 MG tablet Take 1 tablet (500 mg total) by mouth 3 (three) times daily., Starting Sun 02/01/2017, Print    magic mouthwash w/lidocaine SOLN Take 10 mLs by mouth 4 (four) times daily as needed for mouth pain., Starting Sun 02/01/2017, Print    sulfacetamide (BLEPH-10) 10 % ophthalmic solution Place 2 drops into the left eye every 2 (two) hours while awake. Then 2 drops every 4 hours while awake x 5 days, Starting Sun 02/01/2017, Until Tue 02/03/2017, Print        FINAL CLINICAL IMPRESSION(S) / ED DIAGNOSES  Final diagnoses:  Acute pharyngitis, unspecified etiology  Acute viral conjunctivitis of right eye    If controlled substance prescribed during this visit, 12 month history viewed on the [place] prior to issuing an initial prescription for Schedule II or III opiod.   Note:  This document was prepared using Dragon voice recognition software and may include unintentional dictation errors.    Victorino Dike, FNP 02/06/17 1915    Harvest Dark, MD 02/07/17 [phone]

## 2017-04-01 ENCOUNTER — Ambulatory Visit (INDEPENDENT_AMBULATORY_CARE_PROVIDER_SITE_OTHER): Payer: Medicaid Other | Admitting: Adult Health

## 2017-04-01 ENCOUNTER — Encounter: Payer: Self-pay | Admitting: Adult Health

## 2017-04-01 ENCOUNTER — Other Ambulatory Visit (HOSPITAL_COMMUNITY)
Admission: RE | Admit: 2017-04-01 | Discharge: 2017-04-01 | Disposition: A | Discharge status: Home / Self Care | Payer: PRIVATE HEALTH INSURANCE | Source: Ambulatory Visit | Attending: Adult Health | Admitting: Adult Health

## 2017-04-01 VITALS — BP 108/62 | HR 92 | Ht 63.5 in | Wt 172.0 lb

## 2017-04-01 DIAGNOSIS — Z3009 Encounter for other general counseling and advice on contraception: Secondary | ICD-10-CM

## 2017-04-01 DIAGNOSIS — Z30013 Encounter for initial prescription of injectable contraceptive: Secondary | ICD-10-CM

## 2017-04-01 DIAGNOSIS — Z975 Presence of (intrauterine) contraceptive device: Secondary | ICD-10-CM | POA: Insufficient documentation

## 2017-04-01 DIAGNOSIS — Z01419 Encounter for gynecological examination (general) (routine) without abnormal findings: Secondary | ICD-10-CM

## 2017-04-01 DIAGNOSIS — Z3202 Encounter for pregnancy test, result negative: Secondary | ICD-10-CM

## 2017-04-01 DIAGNOSIS — Z309 Encounter for contraceptive management, unspecified: Secondary | ICD-10-CM

## 2017-04-01 LAB — POCT URINE PREGNANCY: Preg Test, Ur: NEGATIVE

## 2017-04-01 MED ORDER — MEDROXYPROGESTERONE ACETATE 150 MG/ML IM SUSP: 150.0000 mg | INTRAMUSCULAR | 4 refills | Status: DC

## 2017-04-01 NOTE — Patient Instructions (Signed)
NO sex Get [facility] 8/7 Get depo 8/8, and nexplanon removal 8/8

## 2017-04-01 NOTE — Addendum Note (Signed)
Addended by: Gaylyn Rong A on: 04/01/2017 04:30 PM   Modules accepted: Orders

## 2017-04-01 NOTE — Progress Notes (Signed)
Patient ID: Kelsey Donovan, female   DOB: 25-Nov-1990, 26 y.o.   MRN: [id number] History of Present Illness: Kelsey Donovan is a 26 year old white female, single, G2P2 in for a well woman gyn exam and pap, she has family planning medicaid.She has nexplanon that was inserted 03/23/14(at [facility]) and she wants it removed and to get back on Depo provera, has used in the past. Has been having periods til this month.Last sexual encounter 03/18/17.She works at [employer] and is new to Systems analyst.  PCP is Dr Sherron Monday at [facility] in [place].   Current Medications, Allergies, Past Medical History, Past Surgical History, Family History and Social History were reviewed in [employer] record.     Review of Systems: Patient denies any headaches, hearing loss, fatigue, blurred vision, shortness of breath, chest pain, abdominal pain, problems with bowel movements, urination, or intercourse. No joint pain or mood swings.Has had seizure in the past and is not taking meds, they make her sleepy, talk with PCP again. See HPI for positives.    Physical Exam:BP 108/62 (BP Location: Right Arm, Patient Position: Sitting, Cuff Size: Normal)   Pulse 92   Ht 5' 3.5" (1.613 m)   Wt 172 lb (78 kg)   LMP 02/15/2017 (Approximate)   BMI 29.99 kg/m UPT negative. General:  Well developed, well nourished, no acute distress Skin:  Warm and dry Neck:  Midline trachea, normal thyroid, good ROM, no lymphadenopathy Lungs; Clear to auscultation bilaterally Breast:  No dominant palpable mass, retraction, or nipple discharge Cardiovascular: Regular rate and rhythm Abdomen:  Soft, non tender, no hepatosplenomegaly Pelvic:  External genitalia is normal in appearance, no lesions.  The vagina is normal in appearance. Urethra has no lesions or masses. The cervix is bulbous. Pap with CG/CHL performed. Uterus is felt to be normal size, shape, and contour.  No adnexal masses or tenderness noted.Bladder is non  tender, no masses felt. Extremities/musculoskeletal:  No swelling or varicosities noted, no clubbing or cyanosis.Nexplanon rod easily palpated left arm.  Psych:  No mood changes, alert and cooperative,seems happy PHQ 2 score 0.  Impression: 1. Encounter for gynecological examination with Papanicolaou smear of cervix   2. Family planning   3. Nexplanon in place   4. Encounter for initial prescription of injectable contraceptive       Plan: Pap with CG/CHL sent  Check HIV and RPR today Check QHCG 8/7 NO sex Return 8/8 for first depo and nexplanon removal Rx Depo provera 150 mg, disp.# 1  for IM injection every 3 months in office, with 4 refills  Physical in 1 year Pap in 3 if normal F/U with PCP

## 2017-04-02 LAB — HIV ANTIBODY (ROUTINE TESTING W REFLEX): HIV Screen 4th Generation wRfx: NONREACTIVE

## 2017-04-02 LAB — RPR: RPR Ser Ql: NONREACTIVE

## 2017-04-03 LAB — CYTOLOGY - PAP
Adequacy: ABSENT
Chlamydia: NEGATIVE
Diagnosis: NEGATIVE
Neisseria Gonorrhea: NEGATIVE

## 2017-04-08 ENCOUNTER — Ambulatory Visit (INDEPENDENT_AMBULATORY_CARE_PROVIDER_SITE_OTHER): Payer: Medicaid Other | Admitting: Adult Health

## 2017-04-08 VITALS — BP 110/70 | HR 72 | Ht 63.0 in | Wt 173.0 lb

## 2017-04-08 DIAGNOSIS — Z3046 Encounter for surveillance of implantable subdermal contraceptive: Secondary | ICD-10-CM

## 2017-04-08 DIAGNOSIS — Z3049 Encounter for surveillance of other contraceptives: Secondary | ICD-10-CM

## 2017-04-08 LAB — BETA HCG QUANT (REF LAB): hCG Quant: <1 m[IU]/mL

## 2017-04-09 ENCOUNTER — Encounter: Payer: Self-pay | Admitting: Adult Health

## 2017-04-09 MED ORDER — MEDROXYPROGESTERONE ACETATE 150 MG/ML IM SUSP
150.0000 mg | Freq: Once | INTRAMUSCULAR | Status: AC
  Administered 2017-04-08: 150 mg via INTRAMUSCULAR

## 2017-04-09 NOTE — Patient Instructions (Signed)
Use condoms x 2 weeks, keep clean and dry x 24 hours, no heavy lifting, keep steri strips on x 72 hours, Keep pressure dressing on x 24 hours. Follow up prn problems.  

## 2017-04-09 NOTE — Addendum Note (Signed)
Addended by: Diona Fanti A on: 04/09/2017 11:49 AM   Modules accepted: Orders

## 2017-04-09 NOTE — Progress Notes (Signed)
Subjective:     Patient ID: Kelsey Donovan, female   DOB: Aug 30, 1991, 26 y.o.   MRN: [id number]  HPI Kelsey Donovan is a 26 year old white female in for nexplanon removal and to get depo.   Review of Systems For nexplanon removal and get first depo Reviewed past medical,surgical, social and family history. Reviewed medications and allergies.     Objective:   Physical Exam BP 110/70 (BP Location: Left Arm, Patient Position: Sitting)   Pulse 72   Ht 5\' 3"  (1.6 m)   Wt 173 lb (78.5 kg)   LMP 02/15/2017   BMI 30.65 kg/m    QHCG <1 this am.Consent signed, time out called. Left arm cleansed with betadine, and injected with 1.5 cc 1% lidocaine and waited til numb.Under sterile technique a #11 blade was used to make small vertical incision, and a curved forceps was used to easily remove rod. Steri strips applied. Pressure dressing applied. To get depo today.  Assessment:          1. Encounter for Nexplanon removal     Plan:     Use condoms x 2 weeks, keep clean and dry x 24 hours, no heavy lifting, keep steri strips on x 72 hours, Keep pressure dressing on x 24 hours. Follow up prn problems.   Return in 12 weeks for depo

## 2017-07-13 ENCOUNTER — Other Ambulatory Visit: Payer: Self-pay

## 2017-07-22 ENCOUNTER — Ambulatory Visit (INDEPENDENT_AMBULATORY_CARE_PROVIDER_SITE_OTHER): Payer: 59 | Admitting: *Deleted

## 2017-07-22 ENCOUNTER — Other Ambulatory Visit: Payer: Self-pay

## 2017-07-22 ENCOUNTER — Encounter: Payer: Self-pay | Admitting: *Deleted

## 2017-07-22 ENCOUNTER — Other Ambulatory Visit: Payer: 59

## 2017-07-22 DIAGNOSIS — Z3042 Encounter for surveillance of injectable contraceptive: Secondary | ICD-10-CM | POA: Diagnosis not present

## 2017-07-22 LAB — BETA HCG QUANT (REF LAB): hCG Quant: <1 m[IU]/mL

## 2017-07-22 MED ORDER — MEDROXYPROGESTERONE ACETATE 150 MG/ML IM SUSP
150.0000 mg | Freq: Once | INTRAMUSCULAR | Status: AC
  Administered 2017-07-22: 150 mg via INTRAMUSCULAR

## 2017-07-22 NOTE — Progress Notes (Signed)
Pt given DepoProvera 150mg  IM left deltoid without complications. Advised pt to return in 12 weeks for next injection.

## 2017-09-22 ENCOUNTER — Ambulatory Visit (INDEPENDENT_AMBULATORY_CARE_PROVIDER_SITE_OTHER): Payer: BLUE CROSS/BLUE SHIELD | Admitting: Adult Health

## 2017-09-22 ENCOUNTER — Encounter: Payer: Self-pay | Admitting: Adult Health

## 2017-09-22 VITALS — BP 114/60 | HR 92 | Ht 63.5 in | Wt 153.0 lb

## 2017-09-22 DIAGNOSIS — R102 Pelvic and perineal pain: Secondary | ICD-10-CM

## 2017-09-22 DIAGNOSIS — Z113 Encounter for screening for infections with a predominantly sexual mode of transmission: Secondary | ICD-10-CM | POA: Diagnosis not present

## 2017-09-22 DIAGNOSIS — R1032 Left lower quadrant pain: Secondary | ICD-10-CM | POA: Diagnosis not present

## 2017-09-22 MED ORDER — METRONIDAZOLE 500 MG PO TABS: 500.0000 mg | ORAL_TABLET | Freq: Two times a day (BID) | ORAL | 0 refills | Status: DC

## 2017-09-22 MED ORDER — DOXYCYCLINE HYCLATE 100 MG PO CAPS: 100.0000 mg | ORAL_CAPSULE | Freq: Two times a day (BID) | ORAL | 0 refills | Status: DC

## 2017-09-22 NOTE — Progress Notes (Signed)
Subjective:     Patient ID: Kelsey Donovan, female   DOB: Aug 07, 1991, 27 y.o.   MRN: [id number]  HPI Kelsey Donovan is a 27 year old white female in complaining of pelvic pain for 2 weeks, left side more than right.She says has brown to pink spotting with slight odor at times. No nausea or vomiting, but BM looser. She got first depo in November. No itching or burning.No new partners.   Review of Systems +pelvic pain  Spotting brown to pink,has odor at times No nausea or vomiting, BM looser Denies itching or burning  Reviewed past medical,surgical, social and family history. Reviewed medications and allergies.      Objective:   Physical Exam BP 114/60 (BP Location: Left Arm, Patient Position: Sitting, Cuff Size: Normal)   Pulse 92   Ht 5' 3.5" (1.613 m)   Wt 153 lb (69.4 kg)   BMI 26.68 kg/m   Skin warm and dry.Pelvic: external genitalia is normal in appearance no lesions, vagina: scant discharge without odor,urethra has no lesions or masses noted, cervix:smooth and bulbous,no CMT uterus: normal size, shape and contour, + tender, no masses felt, adnexa: no masses,LLQ  tenderness noted. Bladder is non tender and no masses felt. GC/CHL obtained.     Assessment:     1. Pelvic pain   2. LLQ pain   3. Screening examination for STD (sexually transmitted disease)       Plan:     GC/CHL sent Check CBC and ESR Get GYN [place] 1/28 at 3:30,will talk when results back Meds ordered this encounter  Medications  . doxycycline (VIBRAMYCIN) 100 MG capsule    Sig: Take 1 capsule (100 mg total) by mouth 2 (two) times daily.    Dispense:  28 capsule    Refill:  0    Order Specific Question:   Supervising Provider    Answer:   EURE, LUTHER H [[id number]]  . metroNIDAZOLE (FLAGYL) 500 MG tablet    Sig: Take 1 tablet (500 mg total) by mouth 2 (two) times daily.    Dispense:  14 tablet    Refill:  0    Order Specific Question:   Supervising Provider    Answer:   Florian Buff [[id number]]   Review handout on  pelvic pain

## 2017-09-22 NOTE — Patient Instructions (Signed)
Pelvic Pain, Female °Pelvic pain is pain in your lower belly (abdomen), below your belly button and between your hips. The pain may start suddenly (acute), keep coming back (recurring), or last a long time (chronic). Pelvic pain that lasts longer than six months is considered chronic. There are many causes of pelvic pain. Sometimes the cause of your pelvic pain is not known. °Follow these instructions at home: °· Take over-the-counter and prescription medicines only as told by your doctor. °· Rest as told by your doctor. °· Do not have sex it if hurts. °· Keep a journal of your pelvic pain. Write down: °? When the pain started. °? Where the pain is located. °? What seems to make the pain better or worse, such as food or your menstrual cycle. °? Any symptoms you have along with the pain. °· Keep all follow-up visits as told by your doctor. This is important. °Contact a doctor if: °· Medicine does not help your pain. °· Your pain comes back. °· You have new symptoms. °· You have unusual vaginal discharge or bleeding. °· You have a fever or chills. °· You are having a hard time pooping (constipation). °· You have blood in your pee (urine) or poop (stool). °· Your pee smells bad. °· You feel weak or lightheaded. °Get help right away if: °· You have sudden pain that is very bad. °· Your pain continues to get worse. °· You have very bad pain and also have any of the following symptoms: °? A fever. °? Feeling stick to your stomach (nausea). °? Throwing up (vomiting). °? Being very sweaty. °· You pass out (lose consciousness). °This information is not intended to replace advice given to you by your health care provider. Make sure you discuss any questions you have with your health care provider. °Document Released: 02/04/2008 Document Revised: 09/12/2015 Document Reviewed: 06/08/2015 °Elsevier Interactive Patient Education © 2018 [employer] ° °

## 2017-09-23 LAB — CBC
Hematocrit: 35.5 % (ref 34.0–46.6)
Hemoglobin: 11 g/dL — ABNORMAL LOW (ref 11.1–15.9)
MCH: 23.9 pg — ABNORMAL LOW (ref 26.6–33.0)
MCHC: 31 g/dL — ABNORMAL LOW (ref 31.5–35.7)
MCV: 77 fL — ABNORMAL LOW (ref 79–97)
Platelets: 288 10*3/uL (ref 150–379)
RBC: 4.61 x10E6/uL (ref 3.77–5.28)
RDW: 17.8 % — ABNORMAL HIGH (ref 12.3–15.4)
WBC: 6.8 10*3/uL (ref 3.4–10.8)

## 2017-09-23 LAB — SEDIMENTATION RATE: Sed Rate: 8 mm/hr (ref 0–32)

## 2017-09-24 LAB — GC/CHLAMYDIA PROBE AMP
Chlamydia trachomatis, NAA: NEGATIVE
Neisseria gonorrhoeae by PCR: NEGATIVE

## 2017-09-28 ENCOUNTER — Ambulatory Visit (INDEPENDENT_AMBULATORY_CARE_PROVIDER_SITE_OTHER): Payer: BLUE CROSS/BLUE SHIELD

## 2017-09-28 DIAGNOSIS — R1032 Left lower quadrant pain: Secondary | ICD-10-CM | POA: Diagnosis not present

## 2017-09-28 DIAGNOSIS — R102 Pelvic and perineal pain: Secondary | ICD-10-CM

## 2017-09-28 NOTE — Progress Notes (Signed)
PELVIC US TA/TV: homogeneous anteverted uterus,wnl,EEC 5 mm,normal ovaries bilat,simple dominate follicle right ovary 1.9 x 1.6 x 1.3 cm,ovaries appear mobile,no free fluid,bilat adnexal discomfort during ultrasound

## 2017-09-30 ENCOUNTER — Telehealth: Payer: Self-pay | Admitting: Adult Health

## 2017-09-30 NOTE — Telephone Encounter (Signed)
Left message that [place] was normal, hope you feeling better

## 2017-10-14 ENCOUNTER — Ambulatory Visit (INDEPENDENT_AMBULATORY_CARE_PROVIDER_SITE_OTHER): Payer: BLUE CROSS/BLUE SHIELD | Admitting: *Deleted

## 2017-10-14 DIAGNOSIS — Z3042 Encounter for surveillance of injectable contraceptive: Secondary | ICD-10-CM

## 2017-10-14 DIAGNOSIS — Z3202 Encounter for pregnancy test, result negative: Secondary | ICD-10-CM

## 2017-10-14 LAB — POCT URINE PREGNANCY: Preg Test, Ur: NEGATIVE

## 2017-10-14 MED ORDER — MEDROXYPROGESTERONE ACETATE 150 MG/ML IM SUSP
150.0000 mg | Freq: Once | INTRAMUSCULAR | Status: AC
  Administered 2017-10-14: 150 mg via INTRAMUSCULAR

## 2017-10-14 NOTE — Progress Notes (Signed)
Depo Provera 150 mg IM given in right deltoid. Patient tolerated well. Next dose in 12 weeks.

## 2017-10-15 ENCOUNTER — Encounter (HOSPITAL_COMMUNITY): Payer: Self-pay | Admitting: Emergency Medicine

## 2017-10-15 ENCOUNTER — Emergency Department (HOSPITAL_COMMUNITY): Payer: BLUE CROSS/BLUE SHIELD

## 2017-10-15 ENCOUNTER — Other Ambulatory Visit: Payer: Self-pay

## 2017-10-15 ENCOUNTER — Emergency Department (HOSPITAL_COMMUNITY)
Admission: EM | Admit: 2017-10-15 | Discharge: 2017-10-15 | Disposition: A | Discharge status: Home / Self Care | Payer: BLUE CROSS/BLUE SHIELD | Attending: Emergency Medicine | Admitting: Emergency Medicine

## 2017-10-15 DIAGNOSIS — Z79899 Other long term (current) drug therapy: Secondary | ICD-10-CM | POA: Diagnosis not present

## 2017-10-15 DIAGNOSIS — F17210 Nicotine dependence, cigarettes, uncomplicated: Secondary | ICD-10-CM | POA: Diagnosis not present

## 2017-10-15 DIAGNOSIS — J069 Acute upper respiratory infection, unspecified: Secondary | ICD-10-CM

## 2017-10-15 DIAGNOSIS — B9789 Other viral agents as the cause of diseases classified elsewhere: Secondary | ICD-10-CM | POA: Diagnosis not present

## 2017-10-15 DIAGNOSIS — R0981 Nasal congestion: Secondary | ICD-10-CM | POA: Diagnosis present

## 2017-10-15 DIAGNOSIS — R0789 Other chest pain: Secondary | ICD-10-CM

## 2017-10-15 MED ORDER — ACETAMINOPHEN 325 MG PO TABS
650.0000 mg | ORAL_TABLET | Freq: Once | ORAL | Status: AC
  Administered 2017-10-15: 650 mg via ORAL
  Filled 2017-10-15: qty 2

## 2017-10-15 MED ORDER — SODIUM CHLORIDE 0.9 % IV BOLUS (SEPSIS): 1000.0000 mL | Freq: Once | INTRAVENOUS | Status: DC

## 2017-10-15 NOTE — ED Triage Notes (Signed)
Patient complains of cough and congestion in sinuses x 3 days. States she is losing her voice x 3 days. Fever and chills and diarrhea. Denies n/v.

## 2017-10-15 NOTE — Discharge Instructions (Signed)
Take [phone] mg of Tylenol every 6 hours as needed for pain. Do not exceed 4000 mg of Tylenol daily.  Apply ice or heat to the chest wall for comfort.  Drink plenty of fluids and get plenty of rest.  Use warm water salt gargles, tea, throat lozenges, and over-the-counter sprays for relief of sore throat.  Follow-up with your primary care physician for reevaluation of your symptoms in the next 2-3 days.  Return to the emergency department if any concerning signs or symptoms develop such as fever not controlled by Tylenol, severe chest pain, or shortness of breath.

## 2017-10-15 NOTE — ED Provider Notes (Signed)
[facility] EMERGENCY DEPARTMENT Provider Note   CSN: [id number] Arrival date & time: 10/15/17  0720     History   Chief Complaint Chief Complaint  Patient presents with  . Cough  . Nasal Congestion    HPI Kelsey Donovan is a 27 y.o. female with history of nephrolithiasis, thyroid disease mass, and pituitary tumor presents for evaluation of gradual onset, progressively worsening nasal congestion, cough, and chest pain.  She states that 3 days ago she developed a hoarse voice and mild sore throat.  Cough is productive of clear sputum, more recently speckled with a small amount of blood.  This morning she awoke with constant sharp upper chest pain which worsens with cough.  Does not worsen with deep inspiration.  She states she feels better leaning forward and worse laying flat on her back.  Denies shortness of breath.  Endorses nasal congestion.  Denies facial swelling, throat tightness, or drooling.  She has tried over-the-counter cold medications without significant relief of her symptoms.  No known sick contacts.  She did have her flu shot this year.  Febrile to around 100 F at home.  Denies nausea, vomiting, or abdominal pain but has had a few episodes of watery nonbloody stools.  Has had normal bowel movements in between.  No recent travel or surgeries, no gross hemoptysis, no prior history of DVT or PE.  She is on Depo-Provera for birth control.  The history is provided by the patient.    Past Medical History:  Diagnosis Date  . Pituitary tumor   . Renal disorder    kidney stones  . Seizure ([place])   . Thyroid disease     Patient Active Problem List   Diagnosis Date Noted  . Encounter for gynecological examination with Papanicolaou smear of cervix 04/01/2017    Past Surgical History:  Procedure Laterality Date  . gastric bybpass    . TONSILLECTOMY      OB History    Gravida Para Term Preterm AB Living   2 2       2    SAB TAB Ectopic Multiple Live Births         Home Medications    Prior to Admission medications   Medication Sig Start Date End Date Taking? Authorizing Provider  acetaminophen (TYLENOL) 500 MG tablet Take 1,000 mg by mouth every 6 (six) hours as needed for moderate pain.    [provider]  amphetamine-dextroamphetamine (ADDERALL) 30 MG tablet Take 30 mg by mouth 2 (two) times daily.    [provider]  budesonide-formoterol (SYMBICORT) 80-4.5 MCG/ACT inhaler Inhale 2 puffs into the lungs 2 (two) times daily as needed (shortness of breath).    [provider]  cyclobenzaprine (FLEXERIL) 10 MG tablet Take 1 tablet (10 mg total) by mouth every 8 (eight) hours as needed for muscle spasms. Patient not taking: Reported on 09/22/2017 08/20/15   Sable Feil, PA-C  cyclobenzaprine (FLEXERIL) 5 MG tablet Take 1 tablet (5 mg total) by mouth 3 (three) times daily as needed for muscle spasms. Patient not taking: Reported on 04/01/2017 04/11/15   Quintella Reichert, MD  doxycycline (VIBRAMYCIN) 100 MG capsule Take 1 capsule (100 mg total) by mouth 2 (two) times daily. 09/22/17   Estill Dooms, NP  ferrous gluconate (FERGON) 324 MG tablet Take 324 mg by mouth daily with breakfast.    [provider]  levETIRAcetam (KEPPRA) 500 MG tablet Take 1 tablet (500 mg total) by mouth 2 (two) times  daily. Patient not taking: Reported on 04/01/2017 12/17/16   Harvest Dark, MD  levothyroxine (SYNTHROID, LEVOTHROID) 125 MCG tablet Take 125 mcg by mouth daily before breakfast.    [provider]  medroxyPROGESTERone (DEPO-PROVERA) 150 MG/ML injection Inject 1 mL (150 mg total) into the muscle every 3 (three) months. 04/01/17   Estill Dooms, NP  metroNIDAZOLE (FLAGYL) 500 MG tablet Take 1 tablet (500 mg total) by mouth 2 (two) times daily. 09/22/17   Estill Dooms, NP  Multiple Vitamin (MULTIVITAMIN WITH MINERALS) TABS tablet Take 2 tablets by mouth daily.    [provider]   oxyCODONE-acetaminophen (ROXICET) 5-325 MG tablet Take 1 tablet by mouth every 6 (six) hours as needed for severe pain. Patient not taking: Reported on 04/01/2017 04/18/16   Carrie Mew, MD  vitamin B-12 (CYANOCOBALAMIN) 1000 MCG tablet Take 1,000 mcg by mouth daily.    [provider]  Vitamin D, Ergocalciferol, (DRISDOL) 50000 UNITS CAPS capsule Take 50,000 Units by mouth every 7 (seven) days. On Wednesday.    [provider]    Family History Family History  Problem Relation Age of Onset  . Hypertension Maternal Grandmother   . Diabetes Maternal Grandmother   . Diabetes Maternal Grandfather   . Hypertension Maternal Grandfather   . Cancer Mother        cervical  . Hypertension Brother   . Asthma Daughter   . ADD / ADHD Daughter     Social History Social History   Tobacco Use  . Smoking status: Current Every Day Smoker    Packs/day: 0.50    Types: Cigarettes  . Smokeless tobacco: Never Used  Substance Use Topics  . Alcohol use: No    Frequency: Never  . Drug use: No     Allergies   Ibuprofen and Nsaids   Review of Systems Review of Systems  Constitutional: Positive for fever.  HENT: Positive for congestion and sore throat. Negative for drooling and trouble swallowing.   Respiratory: Positive for cough. Negative for shortness of breath.   Cardiovascular: Positive for chest pain.  Gastrointestinal: Positive for diarrhea. Negative for abdominal pain, nausea and vomiting.  All other systems reviewed and are negative.    Physical Exam Updated Vital Signs BP 107/73 (BP Location: Right Arm)   Pulse 93   Temp 98.3 F (36.8 C) (Oral)   Resp 18   Ht 5\' 3"  (1.6 m)   Wt 68 kg (150 lb)   LMP 10/15/2017   SpO2 100%   BMI 26.57 kg/m   Physical Exam  Constitutional: She appears well-developed and well-nourished. No distress.  HENT:  Head: Normocephalic and atraumatic.  Right Ear: External ear normal.  Left Ear: External ear normal.   Mouth/Throat: Oropharynx is clear and moist.  Left maxillary sinus tender to palpation. Nasal septum midline with mild mucosal edema.  TMs without erythema or bulging bilaterally.  Posterior oropharynx with postnasal drip, no erythema, tonsillar hypertrophy, exudates, or uvular deviation.  No trismus.  Tolerating secretions without difficulty.  Speaking with a hoarse voice  Eyes: Conjunctivae are normal. Pupils are equal, round, and reactive to light. Right eye exhibits no discharge. Left eye exhibits no discharge.  Neck: Normal range of motion. Neck supple. No JVD present. No tracheal deviation present.  Cardiovascular: Normal rate, regular rhythm and normal heart sounds.  Pulmonary/Chest: Effort normal and breath sounds normal. No stridor. No respiratory distress. She has no wheezes. She has no rales. She exhibits tenderness.  Anterior chest wall  tender to palpation superiorly.  Equal rise and fall of chest, no increased work of breathing.  Abdominal: Soft. Bowel sounds are normal. She exhibits no distension. There is no tenderness. There is no guarding.  Musculoskeletal: She exhibits no edema.  Lymphadenopathy:    She has no cervical adenopathy.  Neurological: She is alert.  Skin: Skin is warm and dry. No erythema.  Psychiatric: She has a normal mood and affect. Her behavior is normal.  Nursing note and vitals reviewed.    ED Treatments / Results  Labs (all labs ordered are listed, but only abnormal results are displayed) Labs Reviewed - No data to display  EKG  EKG Interpretation  Date/Time:  Thursday October 15 2017 09:34:01 EST Ventricular Rate:  86 PR Interval:    QRS Duration: 89 QT Interval:  371 QTC Calculation: 444 R Axis:   93 Text Interpretation:  Sinus rhythm Borderline right axis deviation Nonspecific T abnrm, anterolateral leads Confirmed by Fredia Sorrow [phone]) on 10/15/2017 10:16:07 AM       Radiology Dg Chest 2 View  Result Date: 10/15/2017 CLINICAL  DATA:  27 year old female with history of coughing congestion the sinuses for the past 3 days. Fevers in chills. EXAM: CHEST  2 VIEW COMPARISON:  Chest x-ray 06/23/2014. FINDINGS: Lung volumes are normal. No consolidative airspace disease. No pleural effusions. No pneumothorax. No pulmonary nodule or mass noted. Pulmonary vasculature and the cardiomediastinal silhouette are within normal limits. IMPRESSION: No radiographic evidence of acute cardiopulmonary disease. Electronically Signed   By: Vinnie Langton M.D.   On: 10/15/2017 10:05    Procedures Procedures (including critical care time)  Medications Ordered in ED Medications  acetaminophen (TYLENOL) tablet 650 mg (650 mg Oral Given 10/15/17 0940)     Initial Impression / Assessment and Plan / ED Course  I have reviewed the triage vital signs and the nursing notes.  Pertinent labs & imaging results that were available during my care of the patient were reviewed by me and considered in my medical decision making (see chart for details).     Patient presents with URI symptoms for 3 days as well as hoarse voice.  Developed chest pain reproducible on palpation and with cough today.  Afebrile, mildly tachycardic initially with resolution while in the ED.  Chest pain is entirely reproducible on palpation and I doubt ACS or MI.  EKG shows no ST segment abnormality or arrhythmias.  I doubt pericarditis, myocarditis.  Chest x-ray shows no acute cardiopulmonary abnormality, no evidence of pneumonia or pleural effusion or bronchitis.  She had some improvement with Tylenol.  Doubt PE given likely infectious etiology of symptoms.  Abdominal examination is benign and I doubt acute intra-abdominal pathology.  Patients symptoms are consistent with URI, likely viral etiology. Discussed that antibiotics are not indicated for viral infections. Pt will be discharged with symptomatic treatment.  Recommend follow-up with primary care physician for reevaluation of  symptoms.  Discussed indications for return to the ED.  Patient and patient's fianc verbalized understanding of and agreement with plan and patient stable for discharge home at this time. Final Clinical Impressions(s) / ED Diagnoses   Final diagnoses:  Viral URI with cough  Chest wall pain    ED Discharge Orders    None       Renita Papa, PA-C 10/15/17 1720    Fredia Sorrow, MD 10/17/17 [phone]

## 2017-11-09 ENCOUNTER — Emergency Department (HOSPITAL_COMMUNITY)
Admission: EM | Admit: 2017-11-09 | Discharge: 2017-11-09 | Disposition: A | Discharge status: Home / Self Care | Payer: BLUE CROSS/BLUE SHIELD | Attending: Emergency Medicine | Admitting: Emergency Medicine

## 2017-11-09 ENCOUNTER — Other Ambulatory Visit: Payer: Self-pay

## 2017-11-09 ENCOUNTER — Emergency Department (HOSPITAL_COMMUNITY): Payer: BLUE CROSS/BLUE SHIELD

## 2017-11-09 ENCOUNTER — Encounter (HOSPITAL_COMMUNITY): Payer: Self-pay | Admitting: Emergency Medicine

## 2017-11-09 DIAGNOSIS — F17210 Nicotine dependence, cigarettes, uncomplicated: Secondary | ICD-10-CM | POA: Diagnosis not present

## 2017-11-09 DIAGNOSIS — R079 Chest pain, unspecified: Secondary | ICD-10-CM | POA: Diagnosis not present

## 2017-11-09 DIAGNOSIS — J40 Bronchitis, not specified as acute or chronic: Secondary | ICD-10-CM | POA: Diagnosis not present

## 2017-11-09 DIAGNOSIS — Z79899 Other long term (current) drug therapy: Secondary | ICD-10-CM | POA: Insufficient documentation

## 2017-11-09 DIAGNOSIS — R05 Cough: Secondary | ICD-10-CM | POA: Diagnosis present

## 2017-11-09 LAB — BASIC METABOLIC PANEL
Anion gap: 10 (ref 5–15)
BUN: 7 mg/dL (ref 6–20)
CO2: 25 mmol/L (ref 22–32)
Calcium: 9.3 mg/dL (ref 8.9–10.3)
Chloride: 101 mmol/L (ref 101–111)
Creatinine, Ser: 0.53 mg/dL (ref 0.44–1.00)
GFR calc Af Amer: >60 mL/min (ref >60)
GFR calc non Af Amer: >60 mL/min (ref >60)
Glucose, Bld: 89 mg/dL (ref 65–99)
Potassium: 3.1 mmol/L — ABNORMAL LOW (ref 3.5–5.1)
Sodium: 136 mmol/L (ref 135–145)

## 2017-11-09 LAB — CBC
HCT: 34.6 % — ABNORMAL LOW (ref 36.0–46.0)
Hemoglobin: 10.2 g/dL — ABNORMAL LOW (ref 12.0–15.0)
MCH: 23.9 pg — ABNORMAL LOW (ref 26.0–34.0)
MCHC: 29.5 g/dL — ABNORMAL LOW (ref 30.0–36.0)
MCV: 81 fL (ref 78.0–100.0)
Platelets: 290 10*3/uL (ref 150–400)
RBC: 4.27 MIL/uL (ref 3.87–5.11)
RDW: 19.5 % — ABNORMAL HIGH (ref 11.5–15.5)
WBC: 6.8 10*3/uL (ref 4.0–10.5)

## 2017-11-09 LAB — HCG, QUANTITATIVE, PREGNANCY: hCG, Beta Chain, Quant, S: <1 m[IU]/mL (ref <5)

## 2017-11-09 LAB — TROPONIN I: Troponin I: <0.03 ng/mL (ref <0.03)

## 2017-11-09 MED ORDER — DEXAMETHASONE 4 MG PO TABS
10.0000 mg | ORAL_TABLET | Freq: Once | ORAL | Status: AC
  Administered 2017-11-09: 10 mg via ORAL
  Filled 2017-11-09: qty 3

## 2017-11-09 MED ORDER — HYDROCODONE-HOMATROPINE 5-1.5 MG/5ML PO SYRP: 5.0000 mL | ORAL_SOLUTION | Freq: Four times a day (QID) | ORAL | 0 refills | Status: DC | PRN

## 2017-11-09 MED ORDER — AZITHROMYCIN 250 MG PO TABS
500.0000 mg | ORAL_TABLET | Freq: Once | ORAL | Status: AC
  Administered 2017-11-09: 500 mg via ORAL
  Filled 2017-11-09: qty 2

## 2017-11-09 MED ORDER — AZITHROMYCIN 250 MG PO TABS: 250.0000 mg | ORAL_TABLET | Freq: Every day | ORAL | 0 refills | Status: DC

## 2017-11-09 MED ORDER — IBUPROFEN 800 MG PO TABS
800.0000 mg | ORAL_TABLET | Freq: Once | ORAL | Status: AC
  Administered 2017-11-09: 800 mg via ORAL
  Filled 2017-11-09: qty 1

## 2017-11-09 MED ORDER — ALBUTEROL SULFATE HFA 108 (90 BASE) MCG/ACT IN AERS
2.0000 | INHALATION_SPRAY | Freq: Once | RESPIRATORY_TRACT | Status: AC
  Administered 2017-11-09: 2 via RESPIRATORY_TRACT
  Filled 2017-11-09: qty 6.7

## 2017-11-09 MED ORDER — IBUPROFEN 800 MG PO TABS: 800.0000 mg | ORAL_TABLET | Freq: Three times a day (TID) | ORAL | 0 refills | Status: DC

## 2017-11-09 NOTE — ED Triage Notes (Signed)
PT brought in by [facility] today. PT c/o left chest shooting pain that started today while at work. PT states recent viral illness with cough.

## 2017-11-09 NOTE — ED Provider Notes (Signed)
Emergency Department Provider Note   I have reviewed the triage vital signs and the nursing notes.   HISTORY  Chief Complaint Chest Pain   HPI Kelsey Donovan is a 27 y.o. female smoker presents emerged from today with a month worse cough but then today she started having some left-sided chest pain.  It is somewhat pressure is somewhat sharp and stabbing in nature just to the left of her sternum.  Worse with deep breath and worse with coughing and worse with palpation.  No rating at this before.  No history of coronary artery disease or blood clots for her.  No history of early coronary disease or strokes in her family.  Patient states she still has a fever and is having worsening yellow productive cough as well.  Has not been on antibiotics.  Her children had the flu but that was about a month ago. No other associated or modifying symptoms.    Past Medical History:  Diagnosis Date  . Pituitary tumor   . Renal disorder    kidney stones  . Seizure ([place])   . Thyroid disease     Patient Active Problem List   Diagnosis Date Noted  . Encounter for gynecological examination with Papanicolaou smear of cervix 04/01/2017    Past Surgical History:  Procedure Laterality Date  . gastric bybpass    . TONSILLECTOMY      Current Outpatient Rx  . Order #: [id number] Class: Historical Med  . Order #: [id number] Class: Historical Med  . Order #: [id number] Class: Historical Med  . Order #: [id number] Class: Historical Med  . Order #: [id number] Class: Historical Med  . Order #: [id number] Class: Normal  . Order #: [id number] Class: Historical Med  . Order #: [id number] Class: Historical Med  . Order #: [id number] Class: Print  . Order #: [id number] Class: Print  . Order #: [id number] Class: Print    Allergies Patient has no known allergies.  Family History  Problem Relation Age of Onset  . Hypertension Maternal Grandmother   . Diabetes Maternal Grandmother   . Diabetes Maternal Grandfather   .  Hypertension Maternal Grandfather   . Cancer Mother        cervical  . Hypertension Brother   . Asthma Daughter   . ADD / ADHD Daughter     Social History Social History   Tobacco Use  . Smoking status: Current Every Day Smoker    Packs/day: 0.50    Types: Cigarettes  . Smokeless tobacco: Never Used  Substance Use Topics  . Alcohol use: No    Frequency: Never  . Drug use: No    Review of Systems  All other systems negative except as documented in the HPI. All pertinent positives and negatives as reviewed in the HPI. ____________________________________________   PHYSICAL EXAM:  VITAL SIGNS: ED Triage Vitals  Enc Vitals Group     BP 11/09/17 1440 131/82     Pulse Rate 11/09/17 1440 86     Resp 11/09/17 1440 16     Temp 11/09/17 1440 99 F (37.2 C)     Temp Source 11/09/17 1440 Oral     SpO2 11/09/17 1440 100 %     Weight 11/09/17 1445 150 lb (68 kg)     Height 11/09/17 1445 5\' 3"  (1.6 m)    Constitutional: Alert and oriented. Well appearing and in no acute distress. Eyes: Conjunctivae are normal. PERRL. EOMI. Head: Atraumatic. Nose: No congestion/rhinnorhea. Mouth/Throat: Mucous membranes are moist.  Oropharynx non-erythematous. Neck: No stridor.  No  meningeal signs.   Cardiovascular: Normal rate, regular rhythm. Good peripheral circulation. Grossly normal heart sounds.   Respiratory: Normal respiratory effort.  No retractions. Lungs CTAB. Gastrointestinal: Soft and nontender. No distention.  Musculoskeletal: No lower extremity tenderness nor edema. No gross deformities of extremities. ttp in left chest. No erythema.  Neurologic:  Normal speech and language. No gross focal neurologic deficits are appreciated.  Skin:  Skin is warm, dry and intact. No rash noted.  ____________________________________________   LABS (all labs ordered are listed, but only abnormal results are displayed)  Labs Reviewed  BASIC METABOLIC PANEL - Abnormal; Notable for the  following components:      Result Value   Potassium 3.1 (*)    All other components within normal limits  CBC - Abnormal; Notable for the following components:   Hemoglobin 10.2 (*)    HCT 34.6 (*)    MCH 23.9 (*)    MCHC 29.5 (*)    RDW 19.5 (*)    All other components within normal limits  TROPONIN I  HCG, QUANTITATIVE, PREGNANCY   ____________________________________________  EKG   EKG Interpretation  Date/Time:  Monday November 09 2017 14:42:03 EDT Ventricular Rate:  78 PR Interval:  134 QRS Duration: 90 QT Interval:  360 QTC Calculation: 410 R Axis:   78 Text Interpretation:  Sinus rhythm with marked sinus arrhythmia Otherwise normal ECG No significant change since last tracing \ Confirmed by Merrily Pew [phone]) on 11/09/2017 8:07:21 PM       ____________________________________________  RADIOLOGY  Dg Chest 2 View  Result Date: 11/09/2017 CLINICAL DATA:  Chest pain and pressure on the left. EXAM: CHEST - 2 VIEW COMPARISON:  10/15/2017 FINDINGS: The heart size and mediastinal contours are within normal limits. Both lungs are clear. The visualized skeletal structures are unremarkable. IMPRESSION: No active cardiopulmonary disease. Electronically Signed   By: Ashley Royalty M.D.   On: 11/09/2017 15:10    ____________________________________________   PROCEDURES  Procedure(s) performed:   Procedures   Counseled patient for approximately 5 minutes regarding smoking cessation. Discussed risks of smoking and how they applied and affected their visit here today. Patient not ready to quit at this time, however will follow up with their primary doctor when they are.   CPT code: [phone]: intermediate counseling for smoking cessation  ____________________________________________   INITIAL IMPRESSION / ASSESSMENT AND PLAN / ED COURSE  SPECT musculoskeletal cause for her symptoms.  She also likely has a atypical pneumonia with a months worth of cough, fever and productive  cough that seems to be worsening.  Is not ready quit smoking this time but will consider it.  We will start antibiotics and supportive care otherwise.  Follow with primary doctor in 3 days if not improving.   Pertinent labs & imaging results that were available during my care of the patient were reviewed by me and considered in my medical decision making (see chart for details).  ____________________________________________  FINAL CLINICAL IMPRESSION(S) / ED DIAGNOSES  Final diagnoses:  Bronchitis  Chest pain, unspecified type     MEDICATIONS GIVEN DURING THIS VISIT:  Medications  ibuprofen (ADVIL,MOTRIN) tablet 800 mg (not administered)  albuterol (PROVENTIL HFA;VENTOLIN HFA) 108 (90 Base) MCG/ACT inhaler 2 puff (not administered)  azithromycin (ZITHROMAX) tablet 500 mg (not administered)  dexamethasone (DECADRON) tablet 10 mg (not administered)     NEW OUTPATIENT MEDICATIONS STARTED DURING THIS VISIT:  New Prescriptions   AZITHROMYCIN (ZITHROMAX) 250 MG TABLET    Take 1 tablet (250 mg  total) by mouth daily. Take 1 every day until finished.   HYDROCODONE-HOMATROPINE (HYCODAN) 5-1.5 MG/5ML SYRUP    Take 5 mLs by mouth every 6 (six) hours as needed for cough.   IBUPROFEN (ADVIL,MOTRIN) 800 MG TABLET    Take 1 tablet (800 mg total) by mouth 3 (three) times daily.    Note:  This note was prepared with assistance of Dragon voice recognition software. Occasional wrong-word or sound-a-like substitutions may have occurred due to the inherent limitations of voice recognition software.   Merrily Pew, MD 11/09/17 2009

## 2017-11-09 NOTE — ED Notes (Signed)
Dr Dayna Barker at bedside, see edp assessment for further,

## 2018-01-06 ENCOUNTER — Ambulatory Visit: Payer: BLUE CROSS/BLUE SHIELD

## 2018-01-11 ENCOUNTER — Encounter: Payer: Self-pay | Admitting: *Deleted

## 2018-01-11 ENCOUNTER — Other Ambulatory Visit: Payer: Self-pay

## 2018-01-11 ENCOUNTER — Ambulatory Visit (INDEPENDENT_AMBULATORY_CARE_PROVIDER_SITE_OTHER): Payer: BLUE CROSS/BLUE SHIELD | Admitting: *Deleted

## 2018-01-11 DIAGNOSIS — Z3202 Encounter for pregnancy test, result negative: Secondary | ICD-10-CM | POA: Diagnosis not present

## 2018-01-11 DIAGNOSIS — Z3042 Encounter for surveillance of injectable contraceptive: Secondary | ICD-10-CM | POA: Diagnosis not present

## 2018-01-11 LAB — POCT URINE PREGNANCY: Preg Test, Ur: NEGATIVE

## 2018-01-11 MED ORDER — MEDROXYPROGESTERONE ACETATE 150 MG/ML IM SUSP
150.0000 mg | Freq: Once | INTRAMUSCULAR | Status: AC
  Administered 2018-01-11: 150 mg via INTRAMUSCULAR

## 2018-01-11 NOTE — Progress Notes (Signed)
Pt given DepoProvera 150mg IM left deltoid without complications. Advised to return in 12 weeks for next injection. 

## 2018-02-09 ENCOUNTER — Encounter: Payer: Self-pay | Admitting: Adult Health

## 2018-02-23 ENCOUNTER — Ambulatory Visit: Payer: BLUE CROSS/BLUE SHIELD | Admitting: Adult Health

## 2018-02-24 ENCOUNTER — Inpatient Hospital Stay: Payer: BLUE CROSS/BLUE SHIELD | Attending: Internal Medicine | Admitting: Internal Medicine

## 2018-04-05 ENCOUNTER — Ambulatory Visit: Payer: BLUE CROSS/BLUE SHIELD

## 2018-04-05 ENCOUNTER — Other Ambulatory Visit: Payer: Self-pay | Admitting: Adult Health

## 2018-04-06 ENCOUNTER — Ambulatory Visit (INDEPENDENT_AMBULATORY_CARE_PROVIDER_SITE_OTHER): Payer: BLUE CROSS/BLUE SHIELD

## 2018-04-06 VITALS — Ht 63.0 in | Wt 166.0 lb

## 2018-04-06 DIAGNOSIS — Z3202 Encounter for pregnancy test, result negative: Secondary | ICD-10-CM

## 2018-04-06 DIAGNOSIS — Z3042 Encounter for surveillance of injectable contraceptive: Secondary | ICD-10-CM

## 2018-04-06 LAB — POCT URINE PREGNANCY: Preg Test, Ur: NEGATIVE

## 2018-04-06 MED ORDER — MEDROXYPROGESTERONE ACETATE 150 MG/ML IM SUSP
150.0000 mg | Freq: Once | INTRAMUSCULAR | Status: AC
  Administered 2018-04-06: 150 mg via INTRAMUSCULAR

## 2018-04-06 NOTE — Progress Notes (Signed)
Pt here for depo injection 150 mg IM given  Rt deltoid. Tolerated well. Return 12 weeks for next injection. Pad CMA

## 2018-05-11 ENCOUNTER — Other Ambulatory Visit: Payer: BLUE CROSS/BLUE SHIELD | Admitting: Advanced Practice Midwife

## 2018-06-15 DIAGNOSIS — F17200 Nicotine dependence, unspecified, uncomplicated: Secondary | ICD-10-CM | POA: Insufficient documentation

## 2018-06-29 ENCOUNTER — Ambulatory Visit (INDEPENDENT_AMBULATORY_CARE_PROVIDER_SITE_OTHER): Payer: BLUE CROSS/BLUE SHIELD

## 2018-06-29 VITALS — Ht 62.0 in | Wt 167.4 lb

## 2018-06-29 DIAGNOSIS — Z3042 Encounter for surveillance of injectable contraceptive: Secondary | ICD-10-CM | POA: Diagnosis not present

## 2018-06-29 DIAGNOSIS — Z3202 Encounter for pregnancy test, result negative: Secondary | ICD-10-CM | POA: Diagnosis not present

## 2018-06-29 LAB — POCT URINE PREGNANCY: Preg Test, Ur: NEGATIVE

## 2018-06-29 MED ORDER — MEDROXYPROGESTERONE ACETATE 150 MG/ML IM SUSP
150.0000 mg | Freq: Once | INTRAMUSCULAR | Status: AC
  Administered 2018-06-29: 150 mg via INTRAMUSCULAR

## 2018-06-29 NOTE — Progress Notes (Signed)
Pt here for depo injection 150 mg IM given rt deltoid.tolerated well. Return 12 weeks for next injection.Pad CMA

## 2018-09-21 ENCOUNTER — Ambulatory Visit (INDEPENDENT_AMBULATORY_CARE_PROVIDER_SITE_OTHER): Payer: Self-pay | Admitting: Adult Health

## 2018-09-21 ENCOUNTER — Other Ambulatory Visit: Payer: Self-pay | Admitting: Adult Health

## 2018-09-21 ENCOUNTER — Encounter: Payer: Self-pay | Admitting: Adult Health

## 2018-09-21 ENCOUNTER — Other Ambulatory Visit (HOSPITAL_COMMUNITY)
Admission: RE | Admit: 2018-09-21 | Discharge: 2018-09-21 | Disposition: A | Discharge status: Home / Self Care | Payer: BLUE CROSS/BLUE SHIELD | Source: Ambulatory Visit | Attending: Adult Health | Admitting: Adult Health

## 2018-09-21 VITALS — BP 125/80 | HR 99 | Ht 62.75 in | Wt 161.0 lb

## 2018-09-21 DIAGNOSIS — Z308 Encounter for other contraceptive management: Secondary | ICD-10-CM

## 2018-09-21 DIAGNOSIS — Z3009 Encounter for other general counseling and advice on contraception: Secondary | ICD-10-CM | POA: Insufficient documentation

## 2018-09-21 DIAGNOSIS — Z113 Encounter for screening for infections with a predominantly sexual mode of transmission: Secondary | ICD-10-CM

## 2018-09-21 DIAGNOSIS — Z01419 Encounter for gynecological examination (general) (routine) without abnormal findings: Secondary | ICD-10-CM

## 2018-09-21 DIAGNOSIS — Z3202 Encounter for pregnancy test, result negative: Secondary | ICD-10-CM

## 2018-09-21 DIAGNOSIS — Z3042 Encounter for surveillance of injectable contraceptive: Secondary | ICD-10-CM | POA: Insufficient documentation

## 2018-09-21 LAB — POCT URINE PREGNANCY: Preg Test, Ur: NEGATIVE

## 2018-09-21 MED ORDER — MEDROXYPROGESTERONE ACETATE 150 MG/ML IM SUSP: INTRAMUSCULAR | 4 refills | Status: DC

## 2018-09-21 MED ORDER — MEDROXYPROGESTERONE ACETATE 150 MG/ML IM SUSP
150.0000 mg | Freq: Once | INTRAMUSCULAR | Status: AC
  Administered 2018-09-21: 150 mg via INTRAMUSCULAR

## 2018-09-21 NOTE — Progress Notes (Signed)
Patient ID: Kelsey Donovan, female   DOB: 1990/09/06, 28 y.o.   MRN: [id number] History of Present Illness: Kelsey Donovan is a 28 year old white female, married, G2P2 in for well woman gyn exam and pap and to get her depo.She is working at [employer]. PCP is Dr Vickki Muff in [place].   Current Medications, Allergies, Past Medical History, Past Surgical History, Family History and Social History were reviewed in [employer] record.     Review of Systems:  Patient denies any headaches, hearing loss, fatigue, blurred vision, shortness of breath, chest pain, problems with bowel movements, urination, or intercourse. No joint pain or mood swings. Since last Wednesday has had some pain in LLQ and diarrhea on and off and some nausea, only vomited once.   Physical Exam:BP 125/80 (BP Location: Left Arm, Patient Position: Sitting, Cuff Size: Normal)   Pulse 99   Ht 5' 2.75" (1.594 m)   Wt 161 lb (73 kg)   LMP 09/14/2018   BMI 28.75 kg/m UPT negative.She received depo today.  General:  Well developed, well nourished, no acute distress Skin:  Warm and dry,+caries Neck:  Midline trachea, normal thyroid, good ROM, no lymphadenopathy Lungs; Clear to auscultation bilaterally Breast:  No dominant palpable mass, retraction, or nipple discharge Cardiovascular: Regular rate and rhythm Abdomen:  Soft, non tender, no hepatosplenomegaly Pelvic:  External genitalia is normal in appearance, no lesions.  The vagina is normal in appearance. Urethra has no lesions or masses. The cervix is bulbous. Pap with GC/CHL and HPV performed. Uterus is felt to be normal size, shape, and contour.  No adnexal masses or tenderness noted.Bladder is non tender, no masses felt. Extremities/musculoskeletal:  No swelling or varicosities noted, no clubbing or cyanosis,has bruise right knee and shin  Psych:  No mood changes, alert and cooperative,seems happy Fall risk is low. PHQ 2 score 0. Examination chaperoned by Levy Pupa  LPN.  Impression: 1. Encounter for gynecological examination with Papanicolaou smear of cervix   2. Family planning   3. Pregnancy examination or test, negative result   4. Encounter for surveillance of injectable contraceptive   5. Screening examination for STD (sexually transmitted disease)   6. Encounter for other contraceptive management       Plan: Check HIV and RPR Pap with HPV and GC/CHL sent  Meds ordered this encounter  Medications  . medroxyPROGESTERone (DEPO-PROVERA) injection 150 mg  . medroxyPROGESTERone (DEPO-PROVERA) 150 MG/ML injection    Sig: ADMINISTER 1 ML(150 MG) IN THE MUSCLE EVERY 3 MONTHS    Dispense:  1 mL    Refill:  4    Order Specific Question:   Supervising Provider    Answer:   Florian Buff [[id number]]  Return in 12 weeks for depo Physical in 1 year Pap in 3 if normal May have GI bug, eat lite, but if not better by Friday see PCP or call here for appt.

## 2018-09-21 NOTE — Progress Notes (Signed)
Pt received Depo in left deltoid. Pt tolerated shot well. Return in 12 weeks for next shot. [place]

## 2018-09-22 LAB — HIV ANTIBODY (ROUTINE TESTING W REFLEX): HIV Screen 4th Generation wRfx: NONREACTIVE

## 2018-09-22 LAB — RPR: RPR Ser Ql: NONREACTIVE

## 2018-09-27 LAB — CYTOLOGY - PAP
Chlamydia: NEGATIVE
Diagnosis: UNDETERMINED — AB
HPV: NOT DETECTED
Neisseria Gonorrhea: NEGATIVE

## 2018-09-28 ENCOUNTER — Telehealth: Payer: Self-pay | Admitting: Adult Health

## 2018-09-28 MED ORDER — METRONIDAZOLE 500 MG PO TABS: 500.0000 mg | ORAL_TABLET | Freq: Two times a day (BID) | ORAL | 0 refills | Status: DC

## 2018-09-28 NOTE — Telephone Encounter (Signed)
Left message that pap was slightly abnormal but had negative, HPV, and GC/CHL, and +BV, sent Rx for flagyl to [facility], no alcohol or sex, during treatment, and repeat pap in 1 year, call if any questions.

## 2018-09-28 NOTE — Telephone Encounter (Signed)
Patient called, she's requesting lab results.  [phone]

## 2018-09-28 NOTE — Telephone Encounter (Signed)
Patient notified Pap abnormal but HPV negative. Will need repeat pap in 1 year.  Verbalized understanding.

## 2018-11-17 DIAGNOSIS — K828 Other specified diseases of gallbladder: Secondary | ICD-10-CM | POA: Insufficient documentation

## 2018-11-18 ENCOUNTER — Emergency Department (HOSPITAL_COMMUNITY): Payer: BLUE CROSS/BLUE SHIELD

## 2018-11-18 ENCOUNTER — Encounter (HOSPITAL_COMMUNITY): Payer: Self-pay

## 2018-11-18 ENCOUNTER — Emergency Department (HOSPITAL_COMMUNITY)
Admission: EM | Admit: 2018-11-18 | Discharge: 2018-11-18 | Disposition: A | Discharge status: Home / Self Care | Payer: BLUE CROSS/BLUE SHIELD | Attending: Emergency Medicine | Admitting: Emergency Medicine

## 2018-11-18 ENCOUNTER — Other Ambulatory Visit: Payer: Self-pay

## 2018-11-18 DIAGNOSIS — Z79899 Other long term (current) drug therapy: Secondary | ICD-10-CM | POA: Insufficient documentation

## 2018-11-18 DIAGNOSIS — R1010 Upper abdominal pain, unspecified: Secondary | ICD-10-CM | POA: Insufficient documentation

## 2018-11-18 DIAGNOSIS — F17210 Nicotine dependence, cigarettes, uncomplicated: Secondary | ICD-10-CM | POA: Diagnosis not present

## 2018-11-18 DIAGNOSIS — R1031 Right lower quadrant pain: Secondary | ICD-10-CM | POA: Diagnosis present

## 2018-11-18 LAB — URINALYSIS, ROUTINE W REFLEX MICROSCOPIC
Bilirubin Urine: NEGATIVE
Glucose, UA: NEGATIVE mg/dL
Hgb urine dipstick: NEGATIVE
Ketones, ur: NEGATIVE mg/dL
Nitrite: NEGATIVE
Protein, ur: NEGATIVE mg/dL
Specific Gravity, Urine: 1.021 (ref 1.005–1.030)
pH: 6 (ref 5.0–8.0)

## 2018-11-18 LAB — COMPREHENSIVE METABOLIC PANEL
ALT: 53 U/L — ABNORMAL HIGH (ref 0–44)
AST: 99 U/L — ABNORMAL HIGH (ref 15–41)
Albumin: 3 g/dL — ABNORMAL LOW (ref 3.5–5.0)
Alkaline Phosphatase: 80 U/L (ref 38–126)
Anion gap: 10 (ref 5–15)
BUN: 8 mg/dL (ref 6–20)
CO2: 22 mmol/L (ref 22–32)
Calcium: 8.1 mg/dL — ABNORMAL LOW (ref 8.9–10.3)
Chloride: 105 mmol/L (ref 98–111)
Creatinine, Ser: 0.41 mg/dL — ABNORMAL LOW (ref 0.44–1.00)
GFR calc Af Amer: >60 mL/min (ref >60)
GFR calc non Af Amer: >60 mL/min (ref >60)
Glucose, Bld: 99 mg/dL (ref 70–99)
Potassium: 3.4 mmol/L — ABNORMAL LOW (ref 3.5–5.1)
Sodium: 137 mmol/L (ref 135–145)
Total Bilirubin: 0.2 mg/dL — ABNORMAL LOW (ref 0.3–1.2)
Total Protein: 6.3 g/dL — ABNORMAL LOW (ref 6.5–8.1)

## 2018-11-18 LAB — CBC WITH DIFFERENTIAL/PLATELET
Abs Immature Granulocytes: 0.01 10*3/uL (ref 0.00–0.07)
Basophils Absolute: 0 10*3/uL (ref 0.0–0.1)
Basophils Relative: 1 %
Eosinophils Absolute: 0.3 10*3/uL (ref 0.0–0.5)
Eosinophils Relative: 10 %
HCT: 35.1 % — ABNORMAL LOW (ref 36.0–46.0)
Hemoglobin: 9.9 g/dL — ABNORMAL LOW (ref 12.0–15.0)
Immature Granulocytes: 0 %
Lymphocytes Relative: 35 %
Lymphs Abs: 1.1 10*3/uL (ref 0.7–4.0)
MCH: 20.1 pg — ABNORMAL LOW (ref 26.0–34.0)
MCHC: 28.2 g/dL — ABNORMAL LOW (ref 30.0–36.0)
MCV: 71.3 fL — ABNORMAL LOW (ref 80.0–100.0)
Monocytes Absolute: 0.2 10*3/uL (ref 0.1–1.0)
Monocytes Relative: 8 %
Neutro Abs: 1.5 10*3/uL — ABNORMAL LOW (ref 1.7–7.7)
Neutrophils Relative %: 46 %
Platelets: 214 10*3/uL (ref 150–400)
RBC: 4.92 MIL/uL (ref 3.87–5.11)
RDW: 19.7 % — ABNORMAL HIGH (ref 11.5–15.5)
WBC: 3.2 10*3/uL — ABNORMAL LOW (ref 4.0–10.5)
nRBC: 0 % (ref 0.0–0.2)

## 2018-11-18 LAB — LIPASE, BLOOD: Lipase: 20 U/L (ref 11–51)

## 2018-11-18 LAB — I-STAT BETA HCG BLOOD, ED (MC, WL, AP ONLY): I-stat hCG, quantitative: <5 m[IU]/mL (ref <5)

## 2018-11-18 MED ORDER — IOHEXOL 300 MG/ML  SOLN
100.0000 mL | Freq: Once | INTRAMUSCULAR | Status: AC | PRN
  Administered 2018-11-18: 100 mL via INTRAVENOUS

## 2018-11-18 MED ORDER — HYDROCODONE-ACETAMINOPHEN 5-325 MG PO TABS: 1.0000 | ORAL_TABLET | Freq: Four times a day (QID) | ORAL | 0 refills | Status: DC | PRN

## 2018-11-18 MED ORDER — HYDROMORPHONE HCL 1 MG/ML IJ SOLN
1.0000 mg | Freq: Once | INTRAMUSCULAR | Status: AC
  Administered 2018-11-18: 1 mg via INTRAVENOUS
  Filled 2018-11-18: qty 1

## 2018-11-18 MED ORDER — IOHEXOL 300 MG/ML  SOLN: 100.0000 mL | Freq: Once | INTRAMUSCULAR | Status: DC | PRN

## 2018-11-18 MED ORDER — ONDANSETRON HCL 4 MG/2ML IJ SOLN
4.0000 mg | Freq: Once | INTRAMUSCULAR | Status: AC
  Administered 2018-11-18: 4 mg via INTRAVENOUS
  Filled 2018-11-18: qty 2

## 2018-11-18 NOTE — ED Provider Notes (Signed)
[facility] EMERGENCY DEPARTMENT Provider Note   CSN: [id number] Arrival date & time: 11/18/18  2683    History   Chief Complaint Chief Complaint  Patient presents with  . Abdominal Pain    HPI Kelsey Donovan is a 28 y.o. female.     Patient complains of abdominal pain right upper quadrant and right lower quadrant.  She is supposed to get a HIDA scan today but decided to come to this hospital instead.  The HIDA scan was going to be at [facility].  The history is provided by the patient. No language interpreter was used.  Abdominal Pain  Pain location:  Generalized Pain quality: aching   Pain radiates to:  Does not radiate Pain severity:  Moderate Onset quality:  Sudden Timing:  Constant Progression:  Waxing and waning Chronicity:  New Context: not alcohol use   Relieved by:  Nothing Worsened by:  Nothing Ineffective treatments:  None tried Associated symptoms: no chest pain, no cough, no diarrhea, no fatigue and no hematuria     Past Medical History:  Diagnosis Date  . Pituitary tumor   . Renal disorder    kidney stones  . Seizure ([place])   . Thyroid disease     Patient Active Problem List   Diagnosis Date Noted  . Encounter for well woman exam with routine gynecological exam 09/21/2018  . Family planning 09/21/2018  . Pregnancy examination or test, negative result 09/21/2018  . Encounter for other contraceptive management 09/21/2018  . Screening examination for STD (sexually transmitted disease) 09/21/2018  . Encounter for surveillance of injectable contraceptive 09/21/2018  . Encounter for gynecological examination with Papanicolaou smear of cervix 04/01/2017    Past Surgical History:  Procedure Laterality Date  . gastric bybpass    . TONSILLECTOMY       OB History    Gravida  2   Para  2   Term      Preterm      AB      Living  2     SAB      TAB      Ectopic      Multiple      Live Births               Home Medications     Prior to Admission medications   Medication Sig Start Date End Date Taking? Authorizing Provider  amphetamine-dextroamphetamine (ADDERALL) 30 MG tablet Take 30 mg by mouth 2 (two) times daily.   Yes [provider]  budesonide-formoterol (SYMBICORT) 80-4.5 MCG/ACT inhaler Inhale 2 puffs into the lungs 2 (two) times daily as needed (shortness of breath).   Yes [provider]  ferrous gluconate (FERGON) 324 MG tablet Take 324 mg by mouth daily with breakfast.   Yes [provider]  ibuprofen (ADVIL,MOTRIN) 800 MG tablet Take 1 tablet (800 mg total) by mouth 3 (three) times daily. Patient taking differently: Take 800 mg by mouth as needed.  11/09/17  Yes Mesner, Corene Cornea, MD  levothyroxine (SYNTHROID, LEVOTHROID) 125 MCG tablet Take 125 mcg by mouth daily before breakfast.   Yes [provider]  medroxyPROGESTERone (DEPO-PROVERA) 150 MG/ML injection ADMINISTER 1 ML(150 MG) IN THE MUSCLE EVERY 3 MONTHS 09/21/18  Yes Estill Dooms, NP  Multiple Vitamin (MULTIVITAMIN WITH MINERALS) TABS tablet Take 2 tablets by mouth daily.   Yes [provider]  omeprazole (PRILOSEC) 20 MG capsule Take 40 mg by mouth daily. 11/08/18  Yes [provider]  Vitamin D, Ergocalciferol, (DRISDOL) 50000 UNITS CAPS capsule Take 50,000 Units by mouth every 7 (seven) days. On Wednesday.   Yes [provider]  cyanocobalamin (,VITAMIN B-12,) 1000 MCG/ML injection Inject 1,000 mcg into the muscle every 30 (thirty) days.    [provider]  HYDROcodone-acetaminophen (NORCO/VICODIN) 5-325 MG tablet Take 1 tablet by mouth every 6 (six) hours as needed. 11/18/18   Milton Ferguson, MD  metroNIDAZOLE (FLAGYL) 500 MG tablet Take 1 tablet (500 mg total) by mouth 2 (two) times daily. Patient not taking: Reported on 11/18/2018 09/28/18   Estill Dooms, NP    Family History Family History  Problem Relation Age of Onset  . Hypertension Maternal Grandmother   .  Diabetes Maternal Grandmother   . Diabetes Maternal Grandfather   . Hypertension Maternal Grandfather   . Cancer Mother        cervical  . Hypertension Brother   . Asthma Daughter   . ADD / ADHD Daughter     Social History Social History   Tobacco Use  . Smoking status: Current Every Day Smoker    Packs/day: 0.50    Types: Cigarettes  . Smokeless tobacco: Never Used  Substance Use Topics  . Alcohol use: No    Frequency: Never  . Drug use: No     Allergies   Patient has no known allergies.   Review of Systems Review of Systems  Constitutional: Negative for appetite change and fatigue.  HENT: Negative for congestion, ear discharge and sinus pressure.   Eyes: Negative for discharge.  Respiratory: Negative for cough.   Cardiovascular: Negative for chest pain.  Gastrointestinal: Positive for abdominal pain. Negative for diarrhea.  Genitourinary: Negative for frequency and hematuria.  Musculoskeletal: Negative for back pain.  Skin: Negative for rash.  Neurological: Negative for seizures and headaches.  Psychiatric/Behavioral: Negative for hallucinations.     Physical Exam Updated Vital Signs BP 97/62   Pulse 77   Temp 97.6 F (36.4 C) (Oral)   Resp 18   Ht 5\' 3"  (1.6 m)   Wt 66.2 kg   SpO2 97%   BMI 25.86 kg/m   Physical Exam Vitals signs and nursing note reviewed.  Constitutional:      Appearance: She is well-developed.  HENT:     Head: Normocephalic.     Nose: Nose normal.  Eyes:     General: No scleral icterus.    Conjunctiva/sclera: Conjunctivae normal.  Neck:     Musculoskeletal: Neck supple.     Thyroid: No thyromegaly.  Cardiovascular:     Rate and Rhythm: Normal rate and regular rhythm.     Heart sounds: No murmur. No friction rub. No gallop.   Pulmonary:     Breath sounds: No stridor. No wheezing or rales.  Chest:     Chest wall: No tenderness.  Abdominal:     General: There is no distension.     Tenderness: There is no abdominal  tenderness. There is no rebound.  Musculoskeletal: Normal range of motion.  Lymphadenopathy:     Cervical: No cervical adenopathy.  Skin:    Findings: No erythema or rash.  Neurological:     Mental Status: She is oriented to person, place, and time.     Motor: No abnormal muscle tone.     Coordination: Coordination normal.  Psychiatric:        Behavior: Behavior normal.      ED Treatments / Results  Labs (all labs ordered are listed, but only  abnormal results are displayed) Labs Reviewed  CBC WITH DIFFERENTIAL/PLATELET - Abnormal; Notable for the following components:      Result Value   WBC 3.2 (*)    Hemoglobin 9.9 (*)    HCT 35.1 (*)    MCV 71.3 (*)    MCH 20.1 (*)    MCHC 28.2 (*)    RDW 19.7 (*)    Neutro Abs 1.5 (*)    All other components within normal limits  COMPREHENSIVE METABOLIC PANEL - Abnormal; Notable for the following components:   Potassium 3.4 (*)    Creatinine, Ser 0.41 (*)    Calcium 8.1 (*)    Total Protein 6.3 (*)    Albumin 3.0 (*)    AST 99 (*)    ALT 53 (*)    Total Bilirubin 0.2 (*)    All other components within normal limits  URINALYSIS, ROUTINE W REFLEX MICROSCOPIC - Abnormal; Notable for the following components:   APPearance CLOUDY (*)    Leukocytes,Ua TRACE (*)    Bacteria, UA RARE (*)    All other components within normal limits  LIPASE, BLOOD  I-STAT BETA HCG BLOOD, ED ([facility], [facility], [facility] ONLY)    EKG None  Radiology Ct Abdomen Pelvis W Contrast  Result Date: 11/18/2018 CLINICAL DATA:  Lower abdominal pain on the right radiating to the back. Pain over the last several months. Weight loss. EXAM: CT ABDOMEN AND PELVIS WITH CONTRAST TECHNIQUE: Multidetector CT imaging of the abdomen and pelvis was performed using the standard protocol following bolus administration of intravenous contrast. CONTRAST:  172mL OMNIPAQUE IOHEXOL 300 MG/ML  SOLN COMPARISON:  Ultrasound 11/15/2018. CT 04/18/2016. FINDINGS: Lower chest: Normal Hepatobiliary:  Fatty change of the liver. This is more pronounced adjacent to the false form ligament. No significant focal finding. No calcified gallstones. Pancreas: Normal Spleen: Normal Adrenals/Urinary Tract: Adrenal glands are normal. Kidneys are normal. No cyst, mass or hydronephrosis. Can not rule out the possibility of tiny bilateral renal calculi, though this could relate to contrast excretion. Certainly there is no evidence of passing stone. No stone in the bladder. Stomach/Bowel: No acute bowel pathology. Evidence of bariatric surgery. No sign of complication. I do not see an appendix. No evidence bowel mass, inflammatory disease or obstruction. Vascular/Lymphatic: Normal Reproductive: Normal Other: No free fluid or air. Musculoskeletal: Normal IMPRESSION: 1. Fatty change of the liver, more pronounced adjacent to the falciform ligament. This could be a cause of upper abdominal pain. 2. Previous bariatric surgery without evidence of complication. 3. No sign of appendicitis.  Appendix is not visualized. 4. Possible small renal calculi but no evidence of obstruction or passing stone. Electronically Signed   By: Nelson Chimes M.D.   On: 11/18/2018 11:06    Procedures Procedures (including critical care time)  Medications Ordered in ED Medications  iohexol (OMNIPAQUE) 300 MG/ML solution 100 mL (has no administration in time range)  ondansetron (ZOFRAN) injection 4 mg (4 mg Intravenous Given 11/18/18 0831)  HYDROmorphone (DILAUDID) injection 1 mg (1 mg Intravenous Given 11/18/18 0831)  iohexol (OMNIPAQUE) 300 MG/ML solution 100 mL (100 mLs Intravenous Contrast Given 11/18/18 1031)     Initial Impression / Assessment and Plan / ED Course  I have reviewed the triage vital signs and the nursing notes.  Pertinent labs & imaging results that were available during my care of the patient were reviewed by me and considered in my medical decision making (see chart for details).        Patient with  persistent  abdominal pain.  CT scan negative.  She is going to follow back up with her general surgeon and get her HIDA scan rescheduled  Final Clinical Impressions(s) / ED Diagnoses   Final diagnoses:  Pain of upper abdomen    ED Discharge Orders         Ordered    HYDROcodone-acetaminophen (NORCO/VICODIN) 5-325 MG tablet  Every 6 hours PRN,   Status:  Discontinued     11/18/18 1207    HYDROcodone-acetaminophen (NORCO/VICODIN) 5-325 MG tablet  Every 6 hours PRN     11/18/18 1208           Milton Ferguson, MD 11/18/18 1223

## 2018-11-18 NOTE — ED Notes (Signed)
Patient transported to CT 

## 2018-11-18 NOTE — Discharge Instructions (Addendum)
Call your surgeon today and tell them why you missed your HIDA scan and that you had a CT scan that was unremarkable here.  You need to get your HIDA scan rescheduled

## 2018-11-18 NOTE — ED Triage Notes (Signed)
Pain started in right lower abdomen, radiates to middle of back and now is going to buttocks and right leg. Pain started right after Christmas. Pt. Stated she lost about 20lbs within the last 3 months. (164 to 146). Pt. Has had N/V and constipation. Pt complains of sharp pains when urinating "feels like needles" that started last night.

## 2018-12-14 ENCOUNTER — Ambulatory Visit: Payer: Self-pay

## 2018-12-15 ENCOUNTER — Ambulatory Visit (INDEPENDENT_AMBULATORY_CARE_PROVIDER_SITE_OTHER): Payer: BLUE CROSS/BLUE SHIELD | Admitting: *Deleted

## 2018-12-15 ENCOUNTER — Other Ambulatory Visit: Payer: Self-pay

## 2018-12-15 DIAGNOSIS — Z3042 Encounter for surveillance of injectable contraceptive: Secondary | ICD-10-CM | POA: Diagnosis not present

## 2018-12-15 MED ORDER — MEDROXYPROGESTERONE ACETATE 150 MG/ML IM SUSP
150.0000 mg | Freq: Once | INTRAMUSCULAR | Status: AC
  Administered 2018-12-15: 16:00:00 150 mg via INTRAMUSCULAR

## 2018-12-15 NOTE — Progress Notes (Signed)
Depo Provera 150mg IM given in right deltoid with no complications. Pt to return in 12 weeks for next injection.  

## 2019-01-14 ENCOUNTER — Emergency Department (HOSPITAL_COMMUNITY)
Admission: EM | Admit: 2019-01-14 | Discharge: 2019-01-14 | Disposition: A | Discharge status: Home / Self Care | Payer: BLUE CROSS/BLUE SHIELD | Attending: Emergency Medicine | Admitting: Emergency Medicine

## 2019-01-14 ENCOUNTER — Emergency Department (HOSPITAL_COMMUNITY): Payer: BLUE CROSS/BLUE SHIELD

## 2019-01-14 ENCOUNTER — Encounter (HOSPITAL_COMMUNITY): Payer: Self-pay | Admitting: *Deleted

## 2019-01-14 ENCOUNTER — Other Ambulatory Visit: Payer: Self-pay

## 2019-01-14 DIAGNOSIS — R1011 Right upper quadrant pain: Secondary | ICD-10-CM | POA: Diagnosis present

## 2019-01-14 DIAGNOSIS — R197 Diarrhea, unspecified: Secondary | ICD-10-CM

## 2019-01-14 DIAGNOSIS — E876 Hypokalemia: Secondary | ICD-10-CM | POA: Diagnosis not present

## 2019-01-14 DIAGNOSIS — R112 Nausea with vomiting, unspecified: Secondary | ICD-10-CM | POA: Diagnosis not present

## 2019-01-14 LAB — COMPREHENSIVE METABOLIC PANEL
ALT: 45 U/L — ABNORMAL HIGH (ref 0–44)
AST: 58 U/L — ABNORMAL HIGH (ref 15–41)
Albumin: 3.1 g/dL — ABNORMAL LOW (ref 3.5–5.0)
Alkaline Phosphatase: 105 U/L (ref 38–126)
Anion gap: 9 (ref 5–15)
BUN: <5 mg/dL — ABNORMAL LOW (ref 6–20)
CO2: 24 mmol/L (ref 22–32)
Calcium: 8.3 mg/dL — ABNORMAL LOW (ref 8.9–10.3)
Chloride: 104 mmol/L (ref 98–111)
Creatinine, Ser: 0.48 mg/dL (ref 0.44–1.00)
GFR calc Af Amer: >60 mL/min (ref >60)
GFR calc non Af Amer: >60 mL/min (ref >60)
Glucose, Bld: 99 mg/dL (ref 70–99)
Potassium: 2.5 mmol/L — CL (ref 3.5–5.1)
Sodium: 137 mmol/L (ref 135–145)
Total Bilirubin: 0.7 mg/dL (ref 0.3–1.2)
Total Protein: 6.2 g/dL — ABNORMAL LOW (ref 6.5–8.1)

## 2019-01-14 LAB — CBC WITH DIFFERENTIAL/PLATELET
Abs Immature Granulocytes: 0.01 10*3/uL (ref 0.00–0.07)
Basophils Absolute: 0 10*3/uL (ref 0.0–0.1)
Basophils Relative: 0 %
Eosinophils Absolute: 0.1 10*3/uL (ref 0.0–0.5)
Eosinophils Relative: 1 %
HCT: 37.4 % (ref 36.0–46.0)
Hemoglobin: 10.8 g/dL — ABNORMAL LOW (ref 12.0–15.0)
Immature Granulocytes: 0 %
Lymphocytes Relative: 31 %
Lymphs Abs: 1.8 10*3/uL (ref 0.7–4.0)
MCH: 22.4 pg — ABNORMAL LOW (ref 26.0–34.0)
MCHC: 28.9 g/dL — ABNORMAL LOW (ref 30.0–36.0)
MCV: 77.6 fL — ABNORMAL LOW (ref 80.0–100.0)
Monocytes Absolute: 0.5 10*3/uL (ref 0.1–1.0)
Monocytes Relative: 9 %
Neutro Abs: 3.3 10*3/uL (ref 1.7–7.7)
Neutrophils Relative %: 59 %
Platelets: 319 10*3/uL (ref 150–400)
RBC: 4.82 MIL/uL (ref 3.87–5.11)
RDW: 24.8 % — ABNORMAL HIGH (ref 11.5–15.5)
WBC: 5.7 10*3/uL (ref 4.0–10.5)
nRBC: 0 % (ref 0.0–0.2)

## 2019-01-14 LAB — URINALYSIS, ROUTINE W REFLEX MICROSCOPIC
Bilirubin Urine: NEGATIVE
Glucose, UA: NEGATIVE mg/dL
Hgb urine dipstick: NEGATIVE
Ketones, ur: NEGATIVE mg/dL
Nitrite: NEGATIVE
Protein, ur: 30 mg/dL — AB
Specific Gravity, Urine: 1.015 (ref 1.005–1.030)
pH: 7 (ref 5.0–8.0)

## 2019-01-14 LAB — I-STAT BETA HCG BLOOD, ED (MC, WL, AP ONLY): I-stat hCG, quantitative: <5 m[IU]/mL (ref <5)

## 2019-01-14 LAB — LIPASE, BLOOD: Lipase: 28 U/L (ref 11–51)

## 2019-01-14 MED ORDER — MORPHINE SULFATE (PF) 4 MG/ML IV SOLN
4.0000 mg | Freq: Once | INTRAVENOUS | Status: AC
  Administered 2019-01-14: 4 mg via INTRAVENOUS
  Filled 2019-01-14: qty 1

## 2019-01-14 MED ORDER — ONDANSETRON 4 MG PO TBDP: 4.0000 mg | ORAL_TABLET | Freq: Three times a day (TID) | ORAL | 0 refills | Status: DC | PRN

## 2019-01-14 MED ORDER — POTASSIUM CHLORIDE CRYS ER 20 MEQ PO TBCR
40.0000 meq | EXTENDED_RELEASE_TABLET | Freq: Once | ORAL | Status: AC
  Administered 2019-01-14: 40 meq via ORAL
  Filled 2019-01-14: qty 2

## 2019-01-14 MED ORDER — IOHEXOL 300 MG/ML  SOLN
75.0000 mL | Freq: Once | INTRAMUSCULAR | Status: AC | PRN
  Administered 2019-01-14: 12:00:00 75 mL via INTRAVENOUS

## 2019-01-14 MED ORDER — POTASSIUM CHLORIDE 10 MEQ/100ML IV SOLN
10.0000 meq | INTRAVENOUS | Status: AC
  Administered 2019-01-14 (×2): 10 meq via INTRAVENOUS
  Filled 2019-01-14 (×2): qty 100

## 2019-01-14 MED ORDER — ONDANSETRON HCL 4 MG/2ML IJ SOLN
4.0000 mg | Freq: Once | INTRAMUSCULAR | Status: AC
  Administered 2019-01-14: 4 mg via INTRAVENOUS
  Filled 2019-01-14: qty 2

## 2019-01-14 MED ORDER — POTASSIUM CHLORIDE CRYS ER 20 MEQ PO TBCR: 20.0000 meq | EXTENDED_RELEASE_TABLET | Freq: Two times a day (BID) | ORAL | 0 refills | Status: DC

## 2019-01-14 NOTE — ED Provider Notes (Addendum)
[facility] EMERGENCY DEPARTMENT Provider Note   CSN: [id number] Arrival date & time: 01/14/19  1014    History   Chief Complaint Chief Complaint  Patient presents with  . Abdominal Pain  . Nausea  . Emesis    HPI Kelsey Donovan is a 28 y.o. female.     Patient to ED with complaint of RUQ abdominal pain, nausea, vomiting x 1 week. She reports a fever with Tmax 101 3 days ago but none since. She has had intermittent problems since lab cholecystectomy in March of this year, notably loose stools since the surgery. She felt her postoperative course was uncomplicated on surgical office recheck, but symptoms that started this week are similar to those experienced in March. She states she started seeing blood tinged mucus in her emesis today, prompting ED evaluation.  The history is provided by the patient. No language interpreter was used.    Past Medical History:  Diagnosis Date  . Pituitary tumor   . Renal disorder    kidney stones  . Seizure ([place])   . Thyroid disease     Patient Active Problem List   Diagnosis Date Noted  . Encounter for well woman exam with routine gynecological exam 09/21/2018  . Family planning 09/21/2018  . Pregnancy examination or test, negative result 09/21/2018  . Encounter for other contraceptive management 09/21/2018  . Screening examination for STD (sexually transmitted disease) 09/21/2018  . Encounter for surveillance of injectable contraceptive 09/21/2018  . Encounter for gynecological examination with Papanicolaou smear of cervix 04/01/2017    Past Surgical History:  Procedure Laterality Date  . CHOLECYSTECTOMY    . gastric bybpass    . TONSILLECTOMY       OB History    Gravida  2   Para  2   Term      Preterm      AB      Living  2     SAB      TAB      Ectopic      Multiple      Live Births               Home Medications    Prior to Admission medications   Medication Sig Start Date End Date Taking?  Authorizing Provider  amphetamine-dextroamphetamine (ADDERALL) 30 MG tablet Take 30 mg by mouth 2 (two) times daily.    [provider]  budesonide-formoterol (SYMBICORT) 80-4.5 MCG/ACT inhaler Inhale 2 puffs into the lungs 2 (two) times daily as needed (shortness of breath).    [provider]  cyanocobalamin (,VITAMIN B-12,) 1000 MCG/ML injection Inject 1,000 mcg into the muscle every 30 (thirty) days.    [provider]  ferrous gluconate (FERGON) 324 MG tablet Take 324 mg by mouth daily with breakfast.    [provider]  HYDROcodone-acetaminophen (NORCO/VICODIN) 5-325 MG tablet Take 1 tablet by mouth every 6 (six) hours as needed. Patient not taking: Reported on 12/15/2018 11/18/18   Milton Ferguson, MD  ibuprofen (ADVIL,MOTRIN) 800 MG tablet Take 1 tablet (800 mg total) by mouth 3 (three) times daily. Patient not taking: Reported on 12/15/2018 11/09/17   Mesner, Corene Cornea, MD  levothyroxine (SYNTHROID, LEVOTHROID) 125 MCG tablet Take 125 mcg by mouth daily before breakfast.    [provider]  medroxyPROGESTERone (DEPO-PROVERA) 150 MG/ML injection ADMINISTER 1 ML(150 MG) IN THE MUSCLE EVERY 3 MONTHS 09/21/18   Estill Dooms, NP  Multiple Vitamin (MULTIVITAMIN WITH MINERALS) TABS tablet Take  2 tablets by mouth daily.    [provider]  omeprazole (PRILOSEC) 20 MG capsule Take 40 mg by mouth daily. 11/08/18   [provider]  Vitamin D, Ergocalciferol, (DRISDOL) 50000 UNITS CAPS capsule Take 50,000 Units by mouth every 7 (seven) days. On Wednesday.    [provider]    Family History Family History  Problem Relation Age of Onset  . Hypertension Maternal Grandmother   . Diabetes Maternal Grandmother   . Diabetes Maternal Grandfather   . Hypertension Maternal Grandfather   . Cancer Mother        cervical  . Hypertension Brother   . Asthma Daughter   . ADD / ADHD Daughter     Social History Social History   Tobacco  Use  . Smoking status: Current Every Day Smoker    Packs/day: 0.50    Types: Cigarettes  . Smokeless tobacco: Never Used  Substance Use Topics  . Alcohol use: No    Frequency: Never  . Drug use: No     Allergies   Patient has no known allergies.   Review of Systems Review of Systems  Constitutional: Positive for fever (See HPI.). Negative for chills.  HENT: Negative.   Respiratory: Negative.  Negative for cough and shortness of breath.   Cardiovascular: Negative.  Negative for chest pain.  Gastrointestinal: Positive for abdominal pain, diarrhea, nausea and vomiting.       See HPI.  Genitourinary: Negative.  Negative for dysuria, frequency, vaginal bleeding and vaginal discharge.  Musculoskeletal: Negative.  Negative for back pain.  Skin: Negative.   Neurological: Negative.  Negative for weakness and headaches.     Physical Exam Updated Vital Signs BP 112/69   Pulse 90   Temp 99 F (37.2 C)   Resp 20   Ht 5\' 3"  (1.6 m)   Wt 63.5 kg   SpO2 100%   BMI 24.80 kg/m   Physical Exam Vitals signs and nursing note reviewed.  Constitutional:      General: She is not in acute distress.    Appearance: She is well-developed. She is not ill-appearing.  HENT:     Head: Normocephalic.  Neck:     Musculoskeletal: Normal range of motion and neck supple.  Cardiovascular:     Rate and Rhythm: Normal rate.  Pulmonary:     Effort: Pulmonary effort is normal.  Chest:     Chest wall: No tenderness.  Abdominal:     General: A surgical scar is present. There is no distension.     Palpations: Abdomen is soft.     Tenderness: There is abdominal tenderness in the right upper quadrant. There is no guarding or rebound.     Hernia: No hernia is present.  Musculoskeletal: Normal range of motion.  Skin:    General: Skin is warm and dry.     Findings: No rash.  Neurological:     Mental Status: She is alert.     Cranial Nerves: No cranial nerve deficit.      ED Treatments /  Results  Labs (all labs ordered are listed, but only abnormal results are displayed) Labs Reviewed  CBC WITH DIFFERENTIAL/PLATELET  LIPASE, BLOOD  COMPREHENSIVE METABOLIC PANEL  URINALYSIS, ROUTINE W REFLEX MICROSCOPIC  I-STAT BETA HCG BLOOD, ED ([facility], [facility], [facility] ONLY)    EKG None  Radiology No results found.  Procedures Procedures (including critical care time)  Medications Ordered in ED Medications  morphine 4 MG/ML injection 4 mg (has no  administration in time range)  ondansetron (ZOFRAN) injection 4 mg (has no administration in time range)     Initial Impression / Assessment and Plan / ED Course  I have reviewed the triage vital signs and the nursing notes.  Pertinent labs & imaging results that were available during my care of the patient were reviewed by me and considered in my medical decision making (see chart for details).        Patient to ED with c/o recurrent RUQ abdominal pain x 1 week, associated with N, V, diarrhea, previous fever (no fever in 3 days). Symptoms are similar to March when her gall bladder was removed.   Labs are stable, with the exception of low potassium of 2.5. She has been given 20 mEq's IV potassium in the ED. Will supplement at home for the next 3-4 days.   VSS. She has been ambulatory in the department. She is in NAD and appears comfortable.   She can be discharged home with recommendation for PCP follow up for recheck.   Final Clinical Impressions(s) / ED Diagnoses   Final diagnoses:  None   1. RUQ abdominal pain 2. N, V  ED Discharge Orders    None       Charlann Lange, PA-C 01/14/19 [place], [place], PA-C 01/14/19 [place], [place], DO 01/17/19 1024

## 2019-01-14 NOTE — ED Notes (Signed)
Date and time results received: 01/14/19 1205 (use smartphrase ".now" to insert current time)  Test: k+ Critical Value: 2.5  Name of Provider Notified: [email or web address]  Orders Received? Or Actions Taken?: see emar
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
## 2019-01-14 NOTE — Discharge Instructions (Addendum)
Your tests are fine except for a low potassium. You have been given potassium here to correct this. Take Zofran for nausea as directed.   Please call your primary care doctor and follow up in the office for recheck next week.

## 2019-01-14 NOTE — ED Notes (Signed)
Pt given crackers per PA approval.

## 2019-01-21 ENCOUNTER — Emergency Department
Admission: EM | Admit: 2019-01-21 | Discharge: 2019-01-21 | Disposition: A | Discharge status: Home / Self Care | Payer: BLUE CROSS/BLUE SHIELD | Attending: Emergency Medicine | Admitting: Emergency Medicine

## 2019-01-21 DIAGNOSIS — Z5321 Procedure and treatment not carried out due to patient leaving prior to being seen by health care provider: Secondary | ICD-10-CM | POA: Insufficient documentation

## 2019-01-21 DIAGNOSIS — R079 Chest pain, unspecified: Secondary | ICD-10-CM | POA: Insufficient documentation

## 2019-01-21 LAB — COMPREHENSIVE METABOLIC PANEL
ALT: 52 U/L — ABNORMAL HIGH (ref 0–44)
AST: 68 U/L — ABNORMAL HIGH (ref 15–41)
Albumin: 3.1 g/dL — ABNORMAL LOW (ref 3.5–5.0)
Alkaline Phosphatase: 109 U/L (ref 38–126)
Anion gap: 7 (ref 5–15)
BUN: 5 mg/dL — ABNORMAL LOW (ref 6–20)
CO2: 24 mmol/L (ref 22–32)
Calcium: 8.4 mg/dL — ABNORMAL LOW (ref 8.9–10.3)
Chloride: 108 mmol/L (ref 98–111)
Creatinine, Ser: 0.47 mg/dL (ref 0.44–1.00)
GFR calc Af Amer: >60 mL/min (ref >60)
GFR calc non Af Amer: >60 mL/min (ref >60)
Glucose, Bld: 103 mg/dL — ABNORMAL HIGH (ref 70–99)
Potassium: 4.1 mmol/L (ref 3.5–5.1)
Sodium: 139 mmol/L (ref 135–145)
Total Bilirubin: 0.3 mg/dL (ref 0.3–1.2)
Total Protein: 6.2 g/dL — ABNORMAL LOW (ref 6.5–8.1)

## 2019-01-21 LAB — CBC
HCT: 33.2 % — ABNORMAL LOW (ref 36.0–46.0)
Hemoglobin: 10.1 g/dL — ABNORMAL LOW (ref 12.0–15.0)
MCH: 23.2 pg — ABNORMAL LOW (ref 26.0–34.0)
MCHC: 30.4 g/dL (ref 30.0–36.0)
MCV: 76.3 fL — ABNORMAL LOW (ref 80.0–100.0)
Platelets: 370 10*3/uL (ref 150–400)
RBC: 4.35 MIL/uL (ref 3.87–5.11)
RDW: 25.1 % — ABNORMAL HIGH (ref 11.5–15.5)
WBC: 7.4 10*3/uL (ref 4.0–10.5)
nRBC: 0 % (ref 0.0–0.2)

## 2019-01-21 LAB — CK: Total CK: 44 U/L (ref 38–234)

## 2019-01-21 NOTE — ED Triage Notes (Addendum)
Patient c/o N/V, bilateral lower extremity weakness/numbness and repeated falls starting Wednesday.   Patient started on antibiotic today by [facility] for UTI.

## 2019-01-21 NOTE — ED Notes (Signed)
Patient states nausea started 3 weeks ago and numbness from waist down started on Saturday.

## 2019-01-21 NOTE — ED Notes (Signed)
Patient up to desk asking about wait times. Patient informed that wait times were not given, but there were still 4 people waiting in the lobby to go back before her. Patient reported that she was going to leave. Patient instructed on dangers of leaving without further treatment. Patient verbalized understanding and reports she intends to leave. Patient left.

## 2019-01-25 DIAGNOSIS — R531 Weakness: Secondary | ICD-10-CM | POA: Insufficient documentation

## 2019-01-31 DIAGNOSIS — G610 Guillain-Barre syndrome: Secondary | ICD-10-CM | POA: Insufficient documentation

## 2019-03-01 ENCOUNTER — Other Ambulatory Visit: Payer: Self-pay

## 2019-03-01 ENCOUNTER — Ambulatory Visit: Payer: BC Managed Care – PPO | Attending: Physical Medicine and Rehabilitation | Admitting: Physical Therapy

## 2019-03-01 ENCOUNTER — Encounter: Payer: Self-pay | Admitting: Physical Therapy

## 2019-03-01 DIAGNOSIS — M6281 Muscle weakness (generalized): Secondary | ICD-10-CM | POA: Diagnosis present

## 2019-03-01 DIAGNOSIS — R262 Difficulty in walking, not elsewhere classified: Secondary | ICD-10-CM | POA: Diagnosis present

## 2019-03-01 NOTE — Therapy (Signed)
[facility] [place], [place], [address] Phone: [phone]   Fax:  [phone]  Physical Therapy Evaluation  Patient Details  Name: Kelsey Donovan MRN: [id number] Date of Birth: 1990-09-05 Referring Provider (PT): Ophelia Shoulder, DO   Encounter Date: 03/01/2019  PT End of Session - 03/01/19 1641    Visit Number  1    Number of Visits  12    Date for PT Re-Evaluation  04/19/19    Authorization Type  Progress note every 10th visit    PT Start Time  1500    PT Stop Time  1558    PT Time Calculation (min)  58 min    Equipment Utilized During Treatment  Other (comment)   rollator   Activity Tolerance  Patient tolerated treatment well    Behavior During Therapy  [facility] for tasks assessed/performed       Past Medical History:  Diagnosis Date  . Pituitary tumor   . Renal disorder    kidney stones  . Seizure ([place])   . Thyroid disease     Past Surgical History:  Procedure Laterality Date  . CHOLECYSTECTOMY    . gastric bybpass    . TONSILLECTOMY      There were no vitals filed for this visit.   Subjective Assessment - 03/01/19 1618    Subjective  COVID-19 screening performed prior to patient entering the building. Patient arrives to physical therapy with reports of insidious LE weakness, loss of sensation in bilateral anterior thighs, and difficulty walking that began in May 2020. Patient reported being admitted to the hospital and they are unsure what is causing the weakness. Per patient report, possible diagnoses included early onset Guillain-Barre Syndrome and thiamine deficiency syndrome. Patient reported having inpatient rehabilitation at [facility]. Patient reports frequent falls up to 20 falls since May. Patient can get up from the floor independently but requires increased time. Her fianc typically assists when she falls. Patient is independent with dressing, bathing and ambulates around her fianc's home with a rollator.  She has difficulties with sit to stands. Patient reports anterior leg pain at worst as 8/10 and pain at best 4/10. Patient's goals are to decrease soreness, get sensation back in her legs, and improve strength.    Limitations  Standing;Walking;House hold activities    How long can you stand comfortably?  very short periods before legs give out.    How long can you walk comfortably?  short distances around home with rollator    Diagnostic tests  MRI: early DDD in lumbar spine with bilateral foraminal narrowing at L4-L5    Patient Stated Goals  get sensation back in my legs improve strength    Currently in Pain?  Yes    Pain Score  4     Pain Location  Leg    Pain Orientation  Left;Right;Anterior    Pain Descriptors / Indicators  Sore    Pain Type  Acute pain    Pain Onset  More than a month ago    Pain Frequency  Constant    Aggravating Factors   increased activity    Pain Relieving Factors  resting         [facility] PT Assessment - 03/01/19 0001      Assessment   Medical Diagnosis  Weakness of both lower extremities, polyradiculopathy, acute imflammatory demylenating polyneuropathy    Referring Provider (PT)  Ophelia Shoulder, DO    Onset Date/Surgical Date  01/24/19  Next MD Visit  March 29, 2019    Prior Therapy  yes, inpatient at [facility]      Precautions   Precautions  [place]   Has the patient fallen in the past 6 months  Yes    How many times?  20+ since ER visit    Has the patient had a decrease in activity level because of a fear of falling?   Yes    Is the patient reluctant to leave their home because of a fear of falling?   Yes      [place] residence    Living Arrangements  Spouse/significant other;Children    Available Help at Discharge  Family    Type of [place] entrance    [place] to live on main level with bedroom/bathroom    Home Equipment  Tub bench      Prior Function    Level of Independence  Independent with household mobility with device;Requires assistive device for independence;Needs assistance with homemaking      Observation/Other Assessments   Skin Integrity  notable bruising throughout LEs from falls       Sensation   Light Touch  Impaired by gross assessment      ROM / Strength   AROM / PROM / Strength  Strength;AROM;PROM      AROM   Overall AROM Comments  decreased active bilateral quad AROM      PROM   Overall PROM Comments  bilateral knee and hip PROM WNL      Strength   Strength Assessment Site  Hip;Knee    Right/Left Hip  Right;Left    Right Hip Flexion  4-/5    Right Hip Extension  3/5    Right Hip ABduction  3/5    Left Hip Flexion  4-/5    Left Hip Extension  3/5    Left Hip ABduction  3/5    Right/Left Knee  Right;Left    Right Knee Flexion  3+/5    Right Knee Extension  3-/5    Left Knee Flexion  3+/5    Left Knee Extension  3-/5      Transfers   Five time sit to stand comments   24 seconds modified; with use of back of legs to assist eccentric control to sit. increase UE support      Ambulation/Gait   Assistive device  Rollator    Gait Pattern  Step-through pattern;Decreased stride length;Right genu recurvatum;Left genu recurvatum;Trunk flexed;Wide base of support    Ramp  5: Supervision      Balance   Balance Assessed  Yes      Standardized Balance Assessment   Standardized Balance Assessment  Five Times Sit to Stand    Five times sit to stand comments   24 seconds modified                Objective measurements completed on examination: See above findings.              PT Education - 03/01/19 1640    Education Details  hamstring set, quad set, bridging, hip adduction, hip abduction    Person(s) Educated  Patient    Methods  Explanation;Demonstration;Handout    Comprehension  Verbalized understanding;Returned demonstration          PT Long Term Goals - 03/01/19 1642  PT LONG  TERM GOAL #1   Title  Patient will be independent with HEP    Time  6    Period  Weeks    Status  New      PT LONG TERM GOAL #2   Title  Patient will demonstrate 3+/5 or greater bilateral knee extension MMT to improve gait.    Time  6    Period  Weeks    Status  New      PT LONG TERM GOAL #3   Title  Patient will improve LE functional strength and decrease the risk of falls by performing modified 5x sit to stand test is 18 seconds or less.    Time  6    Period  Weeks    Status  New             Plan - 03/01/19 1645    Clinical Impression Statement  Patient is a 28 year old female who presents to physical therapy with bilateral LE weakness, decreased balance, and difficulty walking. Patient's modified 5x sit to stand test of 24 seconds categorizes her as a fall risk with decreased LE strength. Patient observed supporting her posterior LEs on edge of chair to sit indicating decreased eccentric quadriceps control bilaterally. Patient was able to identify area of touch during sensation examination but stated more pressure than her normal sensation in bilateral LEs to bilateral ankles. Patient ambulates with a decreased gait speed with a rollator, increased knee extension during stance, decreased stride length, and increased UE support on rollator. Patient and PT discussed and reviewed HEP and plan of care. Patient reported understanding and agreement. Patient inquired using electrical stimulation for strengthening. Patient educated about use but must be cautious especially with her decreased bilateral LE sensation to reduce the risk of electrical burns. Patient reported understanding. Patient would benefit from skilled physical therapy to address deficits and address patient's goals.    Personal Factors and Comorbidities  Age;Time since onset of injury/illness/exacerbation;Comorbidity 1    Comorbidities  Asthma; possible GBS diagnosis    Examination-Activity Limitations  Bed  Mobility;Locomotion Level;Stairs;Stand;Squat;Transfers;Caring for Others;Toileting    Examination-Participation Restrictions  Cleaning;Meal Prep;Interpersonal Relationship    Stability/Clinical Decision Making  Evolving/Moderate complexity    Clinical Decision Making  Moderate    Rehab Potential  Good    PT Frequency  2x / week    PT Duration  6 weeks    PT Treatment/Interventions  ADLs/Self Care Home Management;Gait training;Stair training;Functional mobility training;Electrical Stimulation;Therapeutic activities;Therapeutic exercise;Balance training;Wheelchair mobility training;Patient/family education;Neuromuscular re-education;Manual techniques;Passive range of motion    PT Next Visit Plan  nustep; general LE strengthening in sitting or supine due to fall risk; core strengthening; seated balance activities. quad strengthening (be careful with VMS as patient with decreased sensation bilaterally)    PT Home Exercise Plan  see patient education section    Consulted and Agree with Plan of Care  Patient       Patient will benefit from skilled therapeutic intervention in order to improve the following deficits and impairments:  Abnormal gait, Decreased activity tolerance, Decreased balance, Decreased mobility, Difficulty walking, Decreased strength, Impaired sensation, Pain  Visit Diagnosis: 1. Muscle weakness (generalized)   2. Difficulty in walking, not elsewhere classified        Problem List Patient Active Problem List   Diagnosis Date Noted  . Encounter for well woman exam with routine gynecological exam 09/21/2018  . Family planning 09/21/2018  . Pregnancy examination or test, negative result 09/21/2018  .  Encounter for other contraceptive management 09/21/2018  . Screening examination for STD (sexually transmitted disease) 09/21/2018  . Encounter for surveillance of injectable contraceptive 09/21/2018  . Encounter for gynecological examination with Papanicolaou smear of cervix  04/01/2017   Gabriela Eves, PT, DPT 03/01/2019, 5:06 PM  [facility] [address], [place], [address] Phone: [phone]   Fax:  [phone]  Name: Kelsey Donovan MRN: [id number] Date of Birth: 22-Sep-1990

## 2019-03-08 ENCOUNTER — Encounter: Payer: Self-pay | Admitting: Physical Therapy

## 2019-03-08 ENCOUNTER — Other Ambulatory Visit: Payer: Self-pay

## 2019-03-08 ENCOUNTER — Ambulatory Visit: Payer: BC Managed Care – PPO | Attending: Physical Medicine and Rehabilitation | Admitting: Physical Therapy

## 2019-03-08 DIAGNOSIS — R262 Difficulty in walking, not elsewhere classified: Secondary | ICD-10-CM | POA: Diagnosis present

## 2019-03-08 DIAGNOSIS — M6281 Muscle weakness (generalized): Secondary | ICD-10-CM | POA: Insufficient documentation

## 2019-03-08 NOTE — Therapy (Signed)
[facility] [place], [place], [address] Phone: [phone]   Fax:  [phone]  Physical Therapy Treatment  Patient Details  Name: Kelsey Donovan MRN: [id number] Date of Birth: [birth year] Referring Provider (PT): Ophelia Shoulder, DO   Encounter Date: 03/08/2019  PT End of Session - 03/08/19 1601    Visit Number  2    Number of Visits  12    Date for PT Re-Evaluation  04/19/19    Authorization Type  Progress note every 10th visit    PT Start Time  1554    PT Stop Time  1642    PT Time Calculation (min)  48 min    Equipment Utilized During Treatment  Other (comment)   rollator   Activity Tolerance  Patient tolerated treatment well    Behavior During Therapy  [facility] for tasks assessed/performed       Past Medical History:  Diagnosis Date  . Pituitary tumor   . Renal disorder    kidney stones  . Seizure ([place])   . Thyroid disease     Past Surgical History:  Procedure Laterality Date  . CHOLECYSTECTOMY    . gastric bybpass    . TONSILLECTOMY      There were no vitals filed for this visit.  Subjective Assessment - 03/08/19 1556    Subjective  COVID 19 screening performed on patient upon arrival. Patient reports that she feels as if she relies on rollator too much when she is walking. Reports at times trying to walk without rollator and her family gets on her to avoid falls. Reports more pain here recently. Reports tingling in LEs recently.    Limitations  Standing;Walking;House hold activities    How long can you stand comfortably?  very short periods before legs give out.    How long can you walk comfortably?  short distances around home with rollator    Diagnostic tests  MRI: early DDD in lumbar spine with bilateral foraminal narrowing at L4-L5    Patient Stated Goals  get sensation back in my legs improve strength    Currently in Pain?  Yes    Pain Score  8     Pain Location  Flank    Pain Orientation  Left    Pain Descriptors / Indicators  Sharp;Shooting    Pain Type  Acute pain    Pain Radiating Towards  L ankle    Pain Onset  More than a month ago    Pain Frequency  Intermittent         [facility] PT Assessment - 03/08/19 0001      Assessment   Medical Diagnosis  Weakness of both lower extremities, polyradiculopathy, acute imflammatory demylenating polyneuropathy    Referring Provider (PT)  Ophelia Shoulder, DO    Onset Date/Surgical Date  01/24/19    Next MD Visit  March 29, 2019    Prior Therapy  yes, inpatient at [facility]      Precautions   Precautions  [facility] Limbo Adult PT Treatment/Exercise - 03/08/19 0001      Exercises   Exercises  Knee/Hip      Knee/Hip Exercises: Aerobic   Nustep  L4 x10 min      Knee/Hip Exercises: Seated   Long Arc Quad  AROM;Both;20 reps    [employer]  x3 min    Clamshell with [employer]  Red   x20 reps   Hamstring Curl  Strengthening;Both;20 reps;Limitations    Hamstring Limitations  red theraband    Sit to Sand  15 reps;without UE support      Knee/Hip Exercises: Supine   Short Arc Quad Sets  AROM;Both;3 sets;10 reps    Heel Slides  AAROM;Both;3 sets;10 reps                  PT Long Term Goals - 03/01/19 1642      PT LONG TERM GOAL #1   Title  Patient will be independent with HEP    Time  6    Period  Weeks    Status  New      PT LONG TERM GOAL #2   Title  Patient will demonstrate 3+/5 or greater bilateral knee extension MMT to improve gait.    Time  6    Period  Weeks    Status  New      PT LONG TERM GOAL #3   Title  Patient will improve LE functional strength and decrease the risk of falls by performing modified 5x sit to stand test is 18 seconds or less.    Time  6    Period  Weeks    Status  New            Plan - 03/08/19 1648    Clinical Impression Statement  Patient presented in clinic with reports of intermittant sharp, shooting pain as well as tingling in LEs which has began more  recently. Patient's greatest limitation being of quad weakness at this time and decreased stance time with or without rollator. Patient lacks eccentric control of quads as well based on reliance of posterior LEs on table for sit to stands. Patient did report improvement regarding heel slides without hip ER. Patient heavily encouraged to keep rollator for ambulation for safety and to continue HEP as direction.    Personal Factors and Comorbidities  Age;Time since onset of injury/illness/exacerbation;Comorbidity 1    Comorbidities  Asthma; possible GBS diagnosis    Examination-Activity Limitations  Bed Mobility;Locomotion Level;Stairs;Stand;Squat;Transfers;Caring for Others;Toileting    Examination-Participation Restrictions  Cleaning;Meal Prep;Interpersonal Relationship    Stability/Clinical Decision Making  Evolving/Moderate complexity    Rehab Potential  Good    PT Frequency  2x / week    PT Duration  6 weeks    PT Treatment/Interventions  ADLs/Self Care Home Management;Gait training;Stair training;Functional mobility training;Electrical Stimulation;Therapeutic activities;Therapeutic exercise;Balance training;Wheelchair mobility training;Patient/family education;Neuromuscular re-education;Manual techniques;Passive range of motion    PT Next Visit Plan  nustep; general LE strengthening in sitting or supine due to fall risk; core strengthening; seated balance activities. quad strengthening (be careful with VMS as patient with decreased sensation bilaterally)    PT Home Exercise Plan  see patient education section    Consulted and Agree with Plan of Care  Patient       Patient will benefit from skilled therapeutic intervention in order to improve the following deficits and impairments:  Abnormal gait, Decreased activity tolerance, Decreased balance, Decreased mobility, Difficulty walking, Decreased strength, Impaired sensation, Pain  Visit Diagnosis: 1. Muscle weakness (generalized)   2. Difficulty  in walking, not elsewhere classified        Problem List Patient Active Problem List   Diagnosis Date Noted  . Encounter for well woman exam with routine gynecological exam 09/21/2018  . Family planning 09/21/2018  . Pregnancy examination or test, negative result 09/21/2018  . Encounter for other contraceptive  management 09/21/2018  . Screening examination for STD (sexually transmitted disease) 09/21/2018  . Encounter for surveillance of injectable contraceptive 09/21/2018  . Encounter for gynecological examination with Papanicolaou smear of cervix 04/01/2017    Standley Brooking, PTA 03/08/2019, 4:58 PM  [facility] [address] [place], [place], [address] Phone: [phone]   Fax:  [phone]  Name: Kelsey Donovan MRN: [id number] Date of Birth: [birth year]

## 2019-03-09 ENCOUNTER — Ambulatory Visit (INDEPENDENT_AMBULATORY_CARE_PROVIDER_SITE_OTHER): Payer: BC Managed Care – PPO | Admitting: *Deleted

## 2019-03-09 ENCOUNTER — Other Ambulatory Visit: Payer: Self-pay

## 2019-03-09 DIAGNOSIS — Z3042 Encounter for surveillance of injectable contraceptive: Secondary | ICD-10-CM

## 2019-03-09 MED ORDER — MEDROXYPROGESTERONE ACETATE 150 MG/ML IM SUSP
150.0000 mg | Freq: Once | INTRAMUSCULAR | Status: AC
  Administered 2019-03-09: 17:00:00 150 mg via INTRAMUSCULAR

## 2019-03-09 NOTE — Progress Notes (Signed)
Depo Provera 150mg IM given in right deltoid with no complications. Pt to return in 12 weeks for next injection.  

## 2019-03-10 ENCOUNTER — Ambulatory Visit: Payer: BC Managed Care – PPO | Admitting: Physical Therapy

## 2019-03-10 ENCOUNTER — Other Ambulatory Visit: Payer: Self-pay

## 2019-03-10 ENCOUNTER — Encounter: Payer: Self-pay | Admitting: Physical Therapy

## 2019-03-10 DIAGNOSIS — R262 Difficulty in walking, not elsewhere classified: Secondary | ICD-10-CM

## 2019-03-10 DIAGNOSIS — M6281 Muscle weakness (generalized): Secondary | ICD-10-CM | POA: Diagnosis not present

## 2019-03-10 NOTE — Therapy (Signed)
[facility] [place], [place], [address] Phone: [phone]   Fax:  [phone]  Physical Therapy Treatment  Patient Details  Name: Kelsey Donovan MRN: [id number] Date of Birth: [birth year] Referring Provider (PT): Ophelia Shoulder, DO   Encounter Date: 03/10/2019  PT End of Session - 03/10/19 1557    Visit Number  3    Number of Visits  12    Date for PT Re-Evaluation  04/19/19    Authorization Type  Progress note every 10th visit    PT Start Time  1603    PT Stop Time  1650    PT Time Calculation (min)  47 min    Equipment Utilized During Treatment  Other (comment)   Rollator   Activity Tolerance  Patient tolerated treatment well    Behavior During Therapy  [facility] for tasks assessed/performed       Past Medical History:  Diagnosis Date  . Pituitary tumor   . Renal disorder    kidney stones  . Seizure ([place])   . Thyroid disease     Past Surgical History:  Procedure Laterality Date  . CHOLECYSTECTOMY    . gastric bybpass    . TONSILLECTOMY      There were no vitals filed for this visit.  Subjective Assessment - 03/10/19 1557    Subjective  COVID 19 screening performed on patient upon arrival. Reports soreness after last treatment. Reports that she feels like her legs are getting stronger and be able to stand longer.    Limitations  Standing;Walking;House hold activities    How long can you stand comfortably?  very short periods before legs give out.    How long can you walk comfortably?  short distances around home with rollator    Diagnostic tests  MRI: early DDD in lumbar spine with bilateral foraminal narrowing at L4-L5    Patient Stated Goals  get sensation back in my legs improve strength    Currently in Pain?  No/denies         [facility] PT Assessment - 03/10/19 0001      Assessment   Medical Diagnosis  Weakness of both lower extremities, polyradiculopathy, acute imflammatory demylenating polyneuropathy     Referring Provider (PT)  Ophelia Shoulder, DO    Onset Date/Surgical Date  01/24/19    Next MD Visit  March 29, 2019    Prior Therapy  yes, inpatient at [facility]      Precautions   Precautions  [facility] Adult PT Treatment/Exercise - 03/10/19 0001      Knee/Hip Exercises: Aerobic   Nustep  L4 x12 min      Knee/Hip Exercises: Seated   Long Arc Quad  AROM;Both;20 reps    [employer]  x3 min    Clamshell with [employer]  Red   x20 reps   Marching  Strengthening;Both;20 reps    Hamstring Curl  Strengthening;Both;20 reps;Limitations    Hamstring Limitations  red theraband    Sit to Sand  20 reps;without UE support   superior knee height     Knee/Hip Exercises: Supine   Short Arc Quad Sets  AROM;Both;3 sets;10 reps   3 sec hold each   Constance Haw  Strengthening;20 reps                  PT Long Term Goals - 03/01/19 1642  PT LONG TERM GOAL #1   Title  Patient will be independent with HEP    Time  6    Period  Weeks    Status  New      PT LONG TERM GOAL #2   Title  Patient will demonstrate 3+/5 or greater bilateral knee extension MMT to improve gait.    Time  6    Period  Weeks    Status  New      PT LONG TERM GOAL #3   Title  Patient will improve LE functional strength and decrease the risk of falls by performing modified 5x sit to stand test is 18 seconds or less.    Time  6    Period  Weeks    Status  New            Plan - 03/10/19 1708    Clinical Impression Statement  Patient presented in clinic with only reports of soreness in BLEs since previous treatment. Patient guided through LE strengthening with extensor lag with quad strengthening exercises. Patient did not require as much assist from posterior LEs although still lacking eccentrically. Patient's ultimate goals are walking with LRAD and being more independent.    Personal Factors and Comorbidities  Age;Time since onset of injury/illness/exacerbation;Comorbidity 1     Comorbidities  Asthma; possible GBS diagnosis    Examination-Activity Limitations  Bed Mobility;Locomotion Level;Stairs;Stand;Squat;Transfers;Caring for Others;Toileting    Examination-Participation Restrictions  Cleaning;Meal Prep;Interpersonal Relationship    Stability/Clinical Decision Making  Evolving/Moderate complexity    Rehab Potential  Good    PT Frequency  2x / week    PT Duration  6 weeks    PT Treatment/Interventions  ADLs/Self Care Home Management;Gait training;Stair training;Functional mobility training;Electrical Stimulation;Therapeutic activities;Therapeutic exercise;Balance training;Wheelchair mobility training;Patient/family education;Neuromuscular re-education;Manual techniques;Passive range of motion    PT Next Visit Plan  nustep; general LE strengthening in sitting or supine due to fall risk; core strengthening; seated balance activities. quad strengthening (be careful with VMS as patient with decreased sensation bilaterally)    PT Home Exercise Plan  see patient education section    Consulted and Agree with Plan of Care  Patient       Patient will benefit from skilled therapeutic intervention in order to improve the following deficits and impairments:  Abnormal gait, Decreased activity tolerance, Decreased balance, Decreased mobility, Difficulty walking, Decreased strength, Impaired sensation, Pain  Visit Diagnosis: 1. Muscle weakness (generalized)   2. Difficulty in walking, not elsewhere classified        Problem List Patient Active Problem List   Diagnosis Date Noted  . Encounter for well woman exam with routine gynecological exam 09/21/2018  . Family planning 09/21/2018  . Pregnancy examination or test, negative result 09/21/2018  . Encounter for other contraceptive management 09/21/2018  . Screening examination for STD (sexually transmitted disease) 09/21/2018  . Encounter for surveillance of injectable contraceptive 09/21/2018  . Encounter for  gynecological examination with Papanicolaou smear of cervix 04/01/2017    Standley Brooking, PTA 03/10/2019, [facility] [place], [place], [address] Phone: [phone]   Fax:  [phone]  Name: Kelsey Donovan MRN: [id number] Date of Birth: 12-10-1990

## 2019-03-15 ENCOUNTER — Ambulatory Visit: Payer: BC Managed Care – PPO | Admitting: Physical Therapy

## 2019-03-17 ENCOUNTER — Encounter: Payer: BC Managed Care – PPO | Admitting: Physical Therapy

## 2019-03-22 ENCOUNTER — Ambulatory Visit: Payer: BC Managed Care – PPO | Admitting: Physical Therapy

## 2019-03-22 ENCOUNTER — Encounter: Payer: Self-pay | Admitting: Physical Therapy

## 2019-03-22 ENCOUNTER — Other Ambulatory Visit: Payer: Self-pay

## 2019-03-22 DIAGNOSIS — R262 Difficulty in walking, not elsewhere classified: Secondary | ICD-10-CM

## 2019-03-22 DIAGNOSIS — M6281 Muscle weakness (generalized): Secondary | ICD-10-CM

## 2019-03-22 NOTE — Therapy (Signed)
[facility] [place], [place], [address] Phone: [phone]   Fax:  [phone]  Physical Therapy Treatment  Patient Details  Name: Kelsey Donovan MRN: [id number] Date of Birth: [birth year] Referring Provider (PT): Ophelia Shoulder, DO   Encounter Date: 03/22/2019  PT End of Session - 03/22/19 1652    Visit Number  4    Number of Visits  12    Date for PT Re-Evaluation  04/19/19    Authorization Type  Progress note every 10th visit    PT Start Time  1600    PT Stop Time  1645    PT Time Calculation (min)  45 min    Equipment Utilized During Treatment  Other (comment)   rollator   Activity Tolerance  Patient tolerated treatment well    Behavior During Therapy  [facility] for tasks assessed/performed       Past Medical History:  Diagnosis Date  . Pituitary tumor   . Renal disorder    kidney stones  . Seizure ([place])   . Thyroid disease     Past Surgical History:  Procedure Laterality Date  . CHOLECYSTECTOMY    . gastric bybpass    . TONSILLECTOMY      There were no vitals filed for this visit.  Subjective Assessment - 03/22/19 1610    Subjective  COVID 19 screening performed on patient upon arrival. Patient arrives stating she went for her nerve test on Friday and reported muscles itself are strong but the weakness is starting to spread since last test in June. Patient also reported 4 falls since last visit.    Limitations  Standing;Walking;House hold activities    How long can you stand comfortably?  very short periods before legs give out.    How long can you walk comfortably?  short distances around home with rollator    Diagnostic tests  MRI: early DDD in lumbar spine with bilateral foraminal narrowing at L4-L5    Patient Stated Goals  get sensation back in my legs improve strength    Currently in Pain?  No/denies         [facility] PT Assessment - 03/22/19 0001      Assessment   Medical Diagnosis  Weakness of both  lower extremities, polyradiculopathy, acute imflammatory demylenating polyneuropathy    Referring Provider (PT)  Ophelia Shoulder, DO    Onset Date/Surgical Date  01/24/19    Next MD Visit  March 29, 2019    Prior Therapy  yes, inpatient at [facility]      Precautions   Precautions  [facility] Adult PT Treatment/Exercise - 03/22/19 0001      Exercises   Exercises  Knee/Hip      Knee/Hip Exercises: Aerobic   Nustep  L4 x12 min      Knee/Hip Exercises: Seated   Long Arc Quad  AROM;Both;20 reps    Long [employer] Limitations  with tapping and quick stretch x3 each    [employer]  x3 min    Clamshell with TheraBand  Red   x3   Marching  Strengthening;Both;20 reps    Marching Limitations  x3 minutes    Hamstring Curl  Strengthening;Both;20 reps;Limitations    Hamstring Limitations  red theraband    Sit to [employer]  without UE support;2 sets;10 reps      Knee/Hip Exercises: Supine   [employer]  Strengthening;20 reps    Straight Leg Raises  --      Manual Therapy   Manual Therapy  --    Other Manual Therapy  --                  PT Long Term Goals - 03/01/19 1642      PT LONG TERM GOAL #1   Title  Patient will be independent with HEP    Time  6    Period  Weeks    Status  New      PT LONG TERM GOAL #2   Title  Patient will demonstrate 3+/5 or greater bilateral knee extension MMT to improve gait.    Time  6    Period  Weeks    Status  New      PT LONG TERM GOAL #3   Title  Patient will improve LE functional strength and decrease the risk of falls by performing modified 5x sit to stand test is 18 seconds or less.    Time  6    Period  Weeks    Status  New            Plan - 03/22/19 1653    Clinical Impression Statement  Patient arrived feeling good but a little discouraged with results of nerve test. Patient was able to tolerate treatment well despite fatigue. Patient continues to demonstrate extensor lag with LAQ. PNF techique quick  stretch performed during LAQ with tapping to help activate muscle with minimal response. Patient instructed to continue to perform daily activities but manage time to prevent over fatiguing. Patient reported understanding    Personal Factors and Comorbidities  Age;Time since onset of injury/illness/exacerbation;Comorbidity 1    Comorbidities  Asthma; possible GBS diagnosis    Examination-Activity Limitations  Bed Mobility;Locomotion Level;Stairs;Stand;Squat;Transfers;Caring for Others;Toileting    Examination-Participation Restrictions  Cleaning;Meal Prep;Interpersonal Relationship    Stability/Clinical Decision Making  Evolving/Moderate complexity    Clinical Decision Making  Moderate    Rehab Potential  Good    PT Frequency  2x / week    PT Duration  6 weeks    PT Treatment/Interventions  ADLs/Self Care Home Management;Gait training;Stair training;Functional mobility training;Electrical Stimulation;Therapeutic activities;Therapeutic exercise;Balance training;Wheelchair mobility training;Patient/family education;Neuromuscular re-education;Manual techniques;Passive range of motion    PT Next Visit Plan  nustep; general LE strengthening in sitting or supine due to fall risk; core strengthening; seated balance activities. quad strengthening (be careful with VMS as patient with decreased sensation bilaterally)    Consulted and Agree with Plan of Care  Patient       Patient will benefit from skilled therapeutic intervention in order to improve the following deficits and impairments:  Abnormal gait, Decreased activity tolerance, Decreased balance, Decreased mobility, Difficulty walking, Decreased strength, Impaired sensation, Pain  Visit Diagnosis: 1. Muscle weakness (generalized)   2. Difficulty in walking, not elsewhere classified        Problem List Patient Active Problem List   Diagnosis Date Noted  . Encounter for well woman exam with routine gynecological exam 09/21/2018  . Family  planning 09/21/2018  . Pregnancy examination or test, negative result 09/21/2018  . Encounter for other contraceptive management 09/21/2018  . Screening examination for STD (sexually transmitted disease) 09/21/2018  . Encounter for surveillance of injectable contraceptive 09/21/2018  . Encounter for gynecological examination with Papanicolaou smear of cervix 04/01/2017   Gabriela Eves, PT, DPT 03/22/2019, 5:48 PM  [facility] [place],  [place], [address] Phone: [phone]   Fax:  [phone]  Name: Kelsey Donovan MRN: [id number] Date of Birth: 02-14-1991

## 2019-03-24 ENCOUNTER — Ambulatory Visit: Payer: BC Managed Care – PPO | Admitting: Physical Therapy

## 2019-03-29 ENCOUNTER — Ambulatory Visit: Payer: BC Managed Care – PPO | Admitting: Physical Therapy

## 2019-03-31 ENCOUNTER — Ambulatory Visit: Payer: BC Managed Care – PPO | Admitting: Physical Therapy

## 2019-04-05 ENCOUNTER — Ambulatory Visit: Payer: BC Managed Care – PPO | Admitting: Physical Therapy

## 2019-04-07 ENCOUNTER — Ambulatory Visit: Payer: BC Managed Care – PPO | Admitting: Physical Therapy

## 2019-04-14 ENCOUNTER — Ambulatory Visit: Payer: BC Managed Care – PPO | Attending: Physical Medicine and Rehabilitation | Admitting: *Deleted

## 2019-04-14 ENCOUNTER — Other Ambulatory Visit: Payer: Self-pay

## 2019-04-14 DIAGNOSIS — R262 Difficulty in walking, not elsewhere classified: Secondary | ICD-10-CM | POA: Insufficient documentation

## 2019-04-14 DIAGNOSIS — M6281 Muscle weakness (generalized): Secondary | ICD-10-CM | POA: Diagnosis present

## 2019-04-14 NOTE — Therapy (Signed)
[facility] [place], [place], [address] Phone: [phone]   Fax:  [phone]  Physical Therapy Treatment  Patient Details  Name: Kelsey Donovan MRN: [id number] Date of Birth: 09-29-1990 Referring Provider (PT): Ophelia Shoulder, DO   Encounter Date: 04/14/2019  PT End of Session - 04/14/19 1626    Visit Number  5    Number of Visits  12    Date for PT Re-Evaluation  04/19/19    Authorization Type  Progress note every 10th visit    PT Start Time  1600    PT Stop Time  1650    PT Time Calculation (min)  50 min       Past Medical History:  Diagnosis Date  . Pituitary tumor   . Renal disorder    kidney stones  . Seizure ([place])   . Thyroid disease     Past Surgical History:  Procedure Laterality Date  . CHOLECYSTECTOMY    . gastric bybpass    . TONSILLECTOMY      There were no vitals filed for this visit.  Subjective Assessment - 04/14/19 1620    Subjective  COVID 19 screening performed on patient upon arrival. Doing ok. Able to wear a shoe.  Get braces next week    Limitations  Standing;Walking;House hold activities    How long can you stand comfortably?  very short periods before legs give out.    How long can you walk comfortably?  short distances around home with rollator    Diagnostic tests  MRI: early DDD in lumbar spine with bilateral foraminal narrowing at L4-L5    Patient Stated Goals  get sensation back in my legs improve strength                       [facility] Adult PT Treatment/Exercise - 04/14/19 0001      Exercises   Exercises  Knee/Hip      Knee/Hip Exercises: Aerobic   Nustep  L5 x12 min      Knee/Hip Exercises: Seated   Long Arc Quad  Strengthening;Both;2 sets;10 reps    Long Arc Quad Weight  2 lbs.    Ball Squeeze  x3 min    Clamshell with TheraBand  Red   x3   Marching  Strengthening;Both;20 reps    Marching Limitations  x3 minutes    Hamstring Curl   Strengthening;Both;20 reps;Limitations    Hamstring Limitations  red theraband    Sit to [employer]  without UE support;2 sets;10 reps      Knee/Hip Exercises: Supine   B[employer]  Strengthening;20 reps                  PT Long Term Goals - 03/01/19 1642      PT LONG TERM GOAL #1   Title  Patient will be independent with HEP    Time  6    Period  Weeks    Status  New      PT LONG TERM GOAL #2   Title  Patient will demonstrate 3+/5 or greater bilateral knee extension MMT to improve gait.    Time  6    Period  Weeks    Status  New      PT LONG TERM GOAL #3   Title  Patient will improve LE functional strength and decrease the risk of falls by performing modified 5x sit to stand test is 18 seconds or less.  Time  6    Period  Weeks    Status  New            Plan - 04/14/19 1647    Clinical Impression Statement  Pt arrived today doing fairly well and reportsthat she will be getting braces for each knee next week hopefully. She was able to perform all therex today with added wt to LAQs. Pt was able to perform QS's better today    Personal Factors and Comorbidities  Age;Time since onset of injury/illness/exacerbation;Comorbidity 1    Comorbidities  Asthma; possible GBS diagnosis    Stability/Clinical Decision Making  Evolving/Moderate complexity    Rehab Potential  Good    PT Frequency  2x / week    PT Duration  6 weeks    PT Treatment/Interventions  ADLs/Self Care Home Management;Gait training;Stair training;Functional mobility training;Electrical Stimulation;Therapeutic activities;Therapeutic exercise;Balance training;Wheelchair mobility training;Patient/family education;Neuromuscular re-education;Manual techniques;Passive range of motion    PT Next Visit Plan  nustep; general LE strengthening in sitting or supine due to fall risk; core strengthening; seated balance activities. quad strengthening (be careful with VMS as patient with decreased sensation bilaterally)    PT  Home Exercise Plan  see patient education section    Consulted and Agree with Plan of Care  Patient       Patient will benefit from skilled therapeutic intervention in order to improve the following deficits and impairments:  Abnormal gait, Decreased activity tolerance, Decreased balance, Decreased mobility, Difficulty walking, Decreased strength, Impaired sensation, Pain  Visit Diagnosis: 1. Difficulty in walking, not elsewhere classified   2. Muscle weakness (generalized)        Problem List Patient Active Problem List   Diagnosis Date Noted  . Encounter for well woman exam with routine gynecological exam 09/21/2018  . Family planning 09/21/2018  . Pregnancy examination or test, negative result 09/21/2018  . Encounter for other contraceptive management 09/21/2018  . Screening examination for STD (sexually transmitted disease) 09/21/2018  . Encounter for surveillance of injectable contraceptive 09/21/2018  . Encounter for gynecological examination with Papanicolaou smear of cervix 04/01/2017    RAMSEUR,CHRIS, PTA 04/14/2019, 5:01 PM  [facility] [place], [place], [address] Phone: [phone]   Fax:  [phone]  Name: Kelsey Donovan MRN: [id number] Date of Birth: August 18, 1991

## 2019-04-19 ENCOUNTER — Ambulatory Visit: Payer: BC Managed Care – PPO | Admitting: Physical Therapy

## 2019-04-19 ENCOUNTER — Other Ambulatory Visit: Payer: Self-pay

## 2019-04-19 ENCOUNTER — Encounter: Payer: Self-pay | Admitting: Physical Therapy

## 2019-04-19 DIAGNOSIS — R262 Difficulty in walking, not elsewhere classified: Secondary | ICD-10-CM

## 2019-04-19 DIAGNOSIS — M6281 Muscle weakness (generalized): Secondary | ICD-10-CM

## 2019-04-19 NOTE — Therapy (Signed)
[facility] [place], [place], [address] Phone: [phone]   Fax:  [phone]  Physical Therapy Treatment  Patient Details  Name: Kelsey Donovan MRN: [id number] Date of Birth: 11/12/1990 Referring Provider (PT): Ophelia Shoulder, DO   Encounter Date: 04/19/2019  PT End of Session - 04/19/19 1637    Visit Number  6    Number of Visits  12    Date for PT Re-Evaluation  04/19/19    Authorization Type  Progress note every 10th visit    PT Start Time  1609   late arrival   PT Stop Time  1648    PT Time Calculation (min)  39 min    Equipment Utilized During Treatment  Other (comment)   rollator   Activity Tolerance  Patient tolerated treatment well    Behavior During Therapy  [facility] for tasks assessed/performed       Past Medical History:  Diagnosis Date  . Pituitary tumor   . Renal disorder    kidney stones  . Seizure ([place])   . Thyroid disease     Past Surgical History:  Procedure Laterality Date  . CHOLECYSTECTOMY    . gastric bybpass    . TONSILLECTOMY      There were no vitals filed for this visit.  Subjective Assessment - 04/19/19 1612    Subjective  COVID 19 screening performed on patient upon arrival. Patient will be getting leg braces tomorrow. May be getting same brace on both legs as a trial but will get a spring action brace for the right evenutally.    Limitations  Standing;Walking;House hold activities    How long can you stand comfortably?  very short periods before legs give out.    How long can you walk comfortably?  short distances around home with rollator    Diagnostic tests  MRI: early DDD in lumbar spine with bilateral foraminal narrowing at L4-L5    Patient Stated Goals  get sensation back in my legs improve strength    Currently in Pain?  No/denies         [facility] PT Assessment - 04/19/19 0001      Assessment   Medical Diagnosis  Weakness of both lower extremities, polyradiculopathy,  acute imflammatory demylenating polyneuropathy    Referring Provider (PT)  Ophelia Shoulder, DO    Onset Date/Surgical Date  01/24/19    Next MD Visit  March 29, 2019    Prior Therapy  yes, inpatient at [facility]      Precautions   Precautions  [facility] Adult PT Treatment/Exercise - 04/19/19 0001      Exercises   Exercises  Knee/Hip      Knee/Hip Exercises: Aerobic   Nustep  L5 x12 min      Knee/Hip Exercises: Seated   Long Arc Quad  Strengthening;Both;2 sets;10 reps    Long Arc Quad Limitations  with ball squeeze    Ball Squeeze  x3 min    Clamshell with TheraBand  Green   x3 minutes   Sit to [employer]  without UE support;2 sets;10 reps                  PT Long Term Goals - 03/01/19 1642      PT LONG TERM GOAL #1   Title  Patient will be independent with HEP    Time  6    Period  Weeks    Status  New      PT LONG TERM GOAL #2   Title  Patient will demonstrate 3+/5 or greater bilateral knee extension MMT to improve gait.    Time  6    Period  Weeks    Status  New      PT LONG TERM GOAL #3   Title  Patient will improve LE functional strength and decrease the risk of falls by performing modified 5x sit to stand test is 18 seconds or less.    Time  6    Period  Weeks    Status  New            Plan - 04/19/19 1638    Clinical Impression Statement  Patient was able to tolerate treatment fairly well but with ongoing weakness. Patient still has ongoing quadriceps weakness bilaterally L>R. Leg press was attempted to assess strength but unable to perform at this time. Patient demonstrated improved knee extension with ball squeeze. Patient and PT discussed gait training once braces arrive and also attempt other quad strenthening techniques. Patient reported understanding.    Personal Factors and Comorbidities  Age;Time since onset of injury/illness/exacerbation;Comorbidity 1    Comorbidities  Asthma; possible GBS diagnosis     Examination-Activity Limitations  Bed Mobility;Locomotion Level;Stairs;Stand;Squat;Transfers;Caring for Others;Toileting    Examination-Participation Restrictions  Cleaning;Meal Prep;Interpersonal Relationship    Stability/Clinical Decision Making  Evolving/Moderate complexity    Clinical Decision Making  Moderate    Rehab Potential  Good    PT Frequency  2x / week    PT Duration  6 weeks    PT Treatment/Interventions  ADLs/Self Care Home Management;Gait training;Stair training;Functional mobility training;Electrical Stimulation;Therapeutic activities;Therapeutic exercise;Balance training;Wheelchair mobility training;Patient/family education;Neuromuscular re-education;Manual techniques;Passive range of motion    PT Next Visit Plan  nustep; general LE strengthening in sitting or supine due to fall risk; core strengthening; seated balance activities. quad strengthening (be careful with VMS as patient with decreased sensation bilaterally)    Consulted and Agree with Plan of Care  Patient       Patient will benefit from skilled therapeutic intervention in order to improve the following deficits and impairments:  Abnormal gait, Decreased activity tolerance, Decreased balance, Decreased mobility, Difficulty walking, Decreased strength, Impaired sensation, Pain  Visit Diagnosis: 1. Difficulty in walking, not elsewhere classified   2. Muscle weakness (generalized)        Problem List Patient Active Problem List   Diagnosis Date Noted  . Encounter for well woman exam with routine gynecological exam 09/21/2018  . Family planning 09/21/2018  . Pregnancy examination or test, negative result 09/21/2018  . Encounter for other contraceptive management 09/21/2018  . Screening examination for STD (sexually transmitted disease) 09/21/2018  . Encounter for surveillance of injectable contraceptive 09/21/2018  . Encounter for gynecological examination with Papanicolaou smear of cervix 04/01/2017    Gabriela Eves, PT, DPT 04/19/2019, 4:52 PM  [facility] [place], [place], [address] Phone: [phone]   Fax:  [phone]  Name: Kelsey Donovan MRN: [id number] Date of Birth: Sep 28, 1990

## 2019-04-21 ENCOUNTER — Ambulatory Visit: Payer: BC Managed Care – PPO | Admitting: Physical Therapy

## 2019-04-21 ENCOUNTER — Encounter: Payer: Self-pay | Admitting: Physical Therapy

## 2019-04-21 DIAGNOSIS — R262 Difficulty in walking, not elsewhere classified: Secondary | ICD-10-CM | POA: Diagnosis not present

## 2019-04-21 DIAGNOSIS — M6281 Muscle weakness (generalized): Secondary | ICD-10-CM

## 2019-04-21 NOTE — Therapy (Addendum)
[facility] [place], [place], [address] Phone: [phone]   Fax:  [phone]  Physical Therapy Treatment PHYSICAL THERAPY DISCHARGE SUMMARY  Visits from Start of Care: 7  Current functional level related to goals / functional outcomes: See below   Remaining deficits: See goals   Education / Equipment: HEP Plan: Patient agrees to discharge.  Patient goals were not met. Patient is being discharged due to not returning since the last visit.  ?????    Gabriela Eves, PT, DPT 10/20/19    Patient Details  Name: Kelsey Donovan MRN: [id number] Date of Birth: Jan 29, 1991 Referring Provider (PT): Ophelia Shoulder, DO   Encounter Date: 04/21/2019  PT End of Session - 04/21/19 1744    Visit Number  7    Number of Visits  12    Date for PT Re-Evaluation  04/19/19    Authorization Type  Progress note every 10th visit    PT Start Time  1600    PT Stop Time  1653    PT Time Calculation (min)  53 min    Equipment Utilized During Treatment  Other (comment)    Behavior During Therapy  WFL for tasks assessed/performed       Past Medical History:  Diagnosis Date  . Pituitary tumor   . Renal disorder    kidney stones  . Seizure ([place])   . Thyroid disease     Past Surgical History:  Procedure Laterality Date  . CHOLECYSTECTOMY    . gastric bybpass    . TONSILLECTOMY      There were no vitals filed for this visit.  Subjective Assessment - 04/21/19 1626    Subjective  COVID 19 screening performed on patient upon arrival. Patient reports she will be getting custom braces in 2 weeks as the trial was not sufficient for her needs.    Limitations  Standing;Walking;House hold activities    How long can you stand comfortably?  very short periods before legs give out.    How long can you walk comfortably?  short distances around home with rollator    Diagnostic tests  MRI: early DDD in lumbar spine with bilateral foraminal  narrowing at L4-L5    Patient Stated Goals  get sensation back in my legs improve strength    Currently in Pain?  Yes    Pain Score  6     Pain Location  Leg    Pain Orientation  Right;Left;Upper;Anterior    Pain Descriptors / Indicators  Sore    Pain Onset  More than a month ago    Pain Frequency  Intermittent         [facility] PT Assessment - 04/21/19 0001      Assessment   Medical Diagnosis  Weakness of both lower extremities, polyradiculopathy, acute imflammatory demylenating polyneuropathy    Referring Provider (PT)  Ophelia Shoulder, DO    Onset Date/Surgical Date  01/24/19    Next MD Visit  November 2020      Precautions   Precautions  [facility] Limbo Adult PT Treatment/Exercise - 04/21/19 0001      Exercises   Exercises  Knee/Hip      Knee/Hip Exercises: Aerobic   Nustep  L6 x12 min      Knee/Hip Exercises: Seated   Long Arc Quad  Strengthening;Both;10 reps;3 sets    [employer] Limitations  with ball squeeze    Ball Squeeze  x3 min    Clamshell with TheraBand  Red   x30   Marching  Strengthening;Both;20 reps    Marching Limitations  x3 minutes red theraband      Knee/Hip Exercises: Supine   Short Primary school teacher;Other (comment)    Short Arc Quad Sets Limitations  VMS to bilateral Quads and VMO to improve muscular contraction    Bridges with Clamshell  AROM;Both;10 reps      Modalities   Modalities  Teacher, English as a foreign language Location  bilateral quads and VMO    Electrical Stimulation Action  VMS    Electrical Stimulation Parameters  260 usec, 100 pps, 10/10 2 sec ramp    right intensity: 34; Left intensity: 23   Electrical Stimulation Goals  Strength;Neuromuscular facilitation                  PT Long Term Goals - 03/01/19 1642      PT LONG TERM GOAL #1   Title  Patient will be independent with HEP    Time  6    Period  Weeks    Status  New       PT LONG TERM GOAL #2   Title  Patient will demonstrate 3+/5 or greater bilateral knee extension MMT to improve gait.    Time  6    Period  Weeks    Status  New      PT LONG TERM GOAL #3   Title  Patient will improve LE functional strength and decrease the risk of falls by performing modified 5x sit to stand test is 18 seconds or less.    Time  6    Period  Weeks    Status  New            Plan - 04/21/19 1749    Clinical Impression Statement  Patient responded well to therapy sessions and was able to tolerate progression of TEs with just reports of muscle fatigue. Patient and PT discussed her plan of care regarding her leg braces as well as gait training etc when they arrive. Patient also educated on VMS and precautions particularly with her diminished sensation. Patient responded well but still with extension lag with SAQ with VMS. Patient instructed to keep an eye out on her weakness, soreness or changes in sensation after today's visit and report next visit. Patient reported understanding. No adverse affects noted upon removal of modalities.    Personal Factors and Comorbidities  Age;Time since onset of injury/illness/exacerbation;Comorbidity 1    Comorbidities  Asthma; possible GBS diagnosis    Examination-Activity Limitations  Bed Mobility;Locomotion Level;Stairs;Stand;Squat;Transfers;Caring for Others;Toileting    Examination-Participation Restrictions  Cleaning;Meal Prep;Interpersonal Relationship    Stability/Clinical Decision Making  Evolving/Moderate complexity    Clinical Decision Making  Moderate    Rehab Potential  Good    PT Frequency  2x / week    PT Duration  6 weeks    PT Treatment/Interventions  ADLs/Self Care Home Management;Gait training;Stair training;Functional mobility training;Electrical Stimulation;Therapeutic activities;Therapeutic exercise;Balance training;Wheelchair mobility training;Patient/family education;Neuromuscular re-education;Manual  techniques;Passive range of motion    PT Next Visit Plan  nustep; general LE strengthening in sitting or supine due to fall risk; core strengthening; seated balance activities. quad strengthening (be careful with VMS as patient with decreased sensation bilaterally)    PT Home Exercise Plan  see patient education section  Consulted and Agree with Plan of Care  Patient       Patient will benefit from skilled therapeutic intervention in order to improve the following deficits and impairments:  Abnormal gait, Decreased activity tolerance, Decreased balance, Decreased mobility, Difficulty walking, Decreased strength, Impaired sensation, Pain  Visit Diagnosis: Difficulty in walking, not elsewhere classified  Muscle weakness (generalized)     Problem List Patient Active Problem List   Diagnosis Date Noted  . Encounter for well woman exam with routine gynecological exam 09/21/2018  . Family planning 09/21/2018  . Pregnancy examination or test, negative result 09/21/2018  . Encounter for other contraceptive management 09/21/2018  . Screening examination for STD (sexually transmitted disease) 09/21/2018  . Encounter for surveillance of injectable contraceptive 09/21/2018  . Encounter for gynecological examination with Papanicolaou smear of cervix 04/01/2017   Gabriela Eves, PT, DPT 04/21/2019, 5:53 PM  [facility] [place], [place], [address] Phone: [phone]   Fax:  [phone]  Name: Kelsey Donovan MRN: [id number] Date of Birth: 01-29-1991

## 2019-04-26 ENCOUNTER — Ambulatory Visit: Payer: BC Managed Care – PPO | Admitting: *Deleted

## 2019-04-28 ENCOUNTER — Encounter: Payer: BC Managed Care – PPO | Admitting: Physical Therapy

## 2019-05-26 DIAGNOSIS — J45909 Unspecified asthma, uncomplicated: Secondary | ICD-10-CM | POA: Insufficient documentation

## 2019-05-26 DIAGNOSIS — K219 Gastro-esophageal reflux disease without esophagitis: Secondary | ICD-10-CM | POA: Insufficient documentation

## 2019-05-26 DIAGNOSIS — M545 Low back pain, unspecified: Secondary | ICD-10-CM | POA: Insufficient documentation

## 2019-05-26 DIAGNOSIS — G8929 Other chronic pain: Secondary | ICD-10-CM | POA: Insufficient documentation

## 2019-06-01 ENCOUNTER — Ambulatory Visit: Payer: BC Managed Care – PPO

## 2019-06-06 ENCOUNTER — Ambulatory Visit: Payer: Medicaid Other

## 2019-06-08 ENCOUNTER — Other Ambulatory Visit: Payer: Self-pay

## 2019-06-08 ENCOUNTER — Ambulatory Visit (INDEPENDENT_AMBULATORY_CARE_PROVIDER_SITE_OTHER): Payer: Medicaid Other

## 2019-06-08 VITALS — Ht 63.0 in | Wt 133.0 lb

## 2019-06-08 DIAGNOSIS — Z3042 Encounter for surveillance of injectable contraceptive: Secondary | ICD-10-CM

## 2019-06-08 MED ORDER — MEDROXYPROGESTERONE ACETATE 150 MG/ML IM SUSP
150.0000 mg | Freq: Once | INTRAMUSCULAR | Status: AC
  Administered 2019-06-08: 150 mg via INTRAMUSCULAR

## 2019-06-08 NOTE — Progress Notes (Signed)
   NURSE VISIT- INJECTION  SUBJECTIVE:  Kelsey Donovan is a 28 y.o. G2P2 female here for a Depo Provera for contraception/period management. She is a GYN patient.   OBJECTIVE:  There were no vitals taken for this visit.  Appears well, in no apparent distress  Injection administered in: Left deltoid  Meds ordered this encounter  Medications  . medroxyPROGESTERone (DEPO-PROVERA) injection 150 mg    ASSESSMENT: GYN patient Depo Provera for contraception/period management  PLAN: Follow-up: in 11-13 weeks for next Depo   Ladonna Snide  06/08/2019 4:06 PM

## 2019-08-01 ENCOUNTER — Other Ambulatory Visit: Payer: Self-pay | Admitting: Adult Health

## 2019-08-31 ENCOUNTER — Ambulatory Visit: Payer: Medicaid Other

## 2019-09-06 ENCOUNTER — Ambulatory Visit (INDEPENDENT_AMBULATORY_CARE_PROVIDER_SITE_OTHER): Payer: Medicaid Other

## 2019-09-06 ENCOUNTER — Other Ambulatory Visit: Payer: Self-pay

## 2019-09-06 ENCOUNTER — Ambulatory Visit: Payer: Medicaid Other

## 2019-09-06 VITALS — Ht 63.0 in | Wt 134.0 lb

## 2019-09-06 DIAGNOSIS — Z3042 Encounter for surveillance of injectable contraceptive: Secondary | ICD-10-CM | POA: Diagnosis not present

## 2019-09-06 MED ORDER — MEDROXYPROGESTERONE ACETATE 150 MG/ML IM SUSP
150.0000 mg | Freq: Once | INTRAMUSCULAR | Status: AC
  Administered 2019-09-06: 14:00:00 150 mg via INTRAMUSCULAR

## 2019-09-06 NOTE — Progress Notes (Signed)
   NURSE VISIT- INJECTION  SUBJECTIVE:  Kelsey Donovan is a 29 y.o. G2P2 female here for a Depo Provera for contraception/period management. She GYN Patient.   OBJECTIVE:  Ht 5\' 3"  (1.6 m)   Wt 134 lb (60.8 kg)   BMI 23.74 kg/m   Appears well, in no apparent distress  Injection administered in: {Injection site RT Deltoid  Meds ordered this encounter  Medications  . medroxyPROGESTERone (DEPO-PROVERA) injection 150 mg    ASSESSMENT: GYN patient Depo Provera for contraception/period management PLAN: Follow-up: in 11-13 weeks for next Depo   Ladonna Snide  09/06/2019 2:09 PM

## 2019-09-19 ENCOUNTER — Other Ambulatory Visit: Payer: Self-pay | Admitting: Adult Health

## 2019-11-23 ENCOUNTER — Other Ambulatory Visit: Payer: Self-pay | Admitting: Adult Health

## 2019-11-24 ENCOUNTER — Other Ambulatory Visit: Payer: Self-pay

## 2019-11-24 ENCOUNTER — Telehealth: Payer: Self-pay | Admitting: *Deleted

## 2019-11-24 ENCOUNTER — Ambulatory Visit (INDEPENDENT_AMBULATORY_CARE_PROVIDER_SITE_OTHER): Payer: Medicaid Other | Admitting: *Deleted

## 2019-11-24 DIAGNOSIS — Z3042 Encounter for surveillance of injectable contraceptive: Secondary | ICD-10-CM

## 2019-11-24 MED ORDER — MEDROXYPROGESTERONE ACETATE 150 MG/ML IM SUSP
150.0000 mg | Freq: Once | INTRAMUSCULAR | Status: AC
  Administered 2019-11-24: 150 mg via INTRAMUSCULAR

## 2019-11-24 NOTE — Progress Notes (Signed)
   NURSE VISIT- INJECTION  SUBJECTIVE:  Kelsey Donovan is a 29 y.o. G2P2 female here for a Depo Provera for contraception/period management. She is a GYN patient.   OBJECTIVE:  There were no vitals taken for this visit.  Appears well, in no apparent distress  Injection administered in: Left deltoid  Meds ordered this encounter  Medications  . medroxyPROGESTERone (DEPO-PROVERA) injection 150 mg    ASSESSMENT: GYN patient Depo Provera for contraception/period management PLAN: Follow-up: in 11-13 weeks for next Depo   Rolena Infante  11/24/2019 10:08 AM

## 2019-11-24 NOTE — Telephone Encounter (Signed)
Patient called does not have Depo at pharmacy. Needs Rx called in. Has appointment today.

## 2019-11-30 ENCOUNTER — Ambulatory Visit: Payer: Medicaid Other

## 2019-12-05 ENCOUNTER — Ambulatory Visit: Payer: Medicaid Other

## 2020-02-09 ENCOUNTER — Ambulatory Visit: Payer: Medicaid Other

## 2020-02-14 ENCOUNTER — Encounter: Payer: Self-pay | Admitting: *Deleted

## 2020-02-14 ENCOUNTER — Ambulatory Visit (INDEPENDENT_AMBULATORY_CARE_PROVIDER_SITE_OTHER): Payer: Medicaid Other | Admitting: *Deleted

## 2020-02-14 DIAGNOSIS — Z3042 Encounter for surveillance of injectable contraceptive: Secondary | ICD-10-CM | POA: Diagnosis not present

## 2020-02-14 DIAGNOSIS — Z308 Encounter for other contraceptive management: Secondary | ICD-10-CM

## 2020-02-14 MED ORDER — MEDROXYPROGESTERONE ACETATE 150 MG/ML IM SUSP
150.0000 mg | Freq: Once | INTRAMUSCULAR | Status: AC
  Administered 2020-02-14: 150 mg via INTRAMUSCULAR

## 2020-02-14 NOTE — Progress Notes (Signed)
   NURSE VISIT- INJECTION  SUBJECTIVE:  Kelsey Donovan is a 29 y.o. G2P2 female here for a Depo Provera for contraception/period management. She is a GYN patient.   OBJECTIVE:  There were no vitals taken for this visit.  Appears well, in no apparent distress  Injection administered in: Right deltoid  Meds ordered this encounter  Medications  . medroxyPROGESTERone (DEPO-PROVERA) injection 150 mg    ASSESSMENT: GYN patient Depo Provera for contraception/period management PLAN: Follow-up: in 11-13 weeks for next Depo   Levy Pupa  02/14/2020 4:20 PM

## 2020-02-24 ENCOUNTER — Other Ambulatory Visit: Payer: Self-pay | Admitting: Adult Health

## 2020-05-08 ENCOUNTER — Ambulatory Visit: Payer: Medicaid Other

## 2020-05-09 ENCOUNTER — Ambulatory Visit (INDEPENDENT_AMBULATORY_CARE_PROVIDER_SITE_OTHER): Payer: Medicaid Other | Admitting: *Deleted

## 2020-05-09 ENCOUNTER — Other Ambulatory Visit: Payer: Self-pay

## 2020-05-09 DIAGNOSIS — Z3042 Encounter for surveillance of injectable contraceptive: Secondary | ICD-10-CM | POA: Diagnosis not present

## 2020-05-09 MED ORDER — MEDROXYPROGESTERONE ACETATE 150 MG/ML IM SUSP
150.0000 mg | Freq: Once | INTRAMUSCULAR | Status: AC
  Administered 2020-05-09: 150 mg via INTRAMUSCULAR

## 2020-05-09 NOTE — Progress Notes (Signed)
   NURSE VISIT- INJECTION  SUBJECTIVE:  Kelsey Donovan is a 29 y.o. G2P2 female here for a Depo Provera for contraception/period management. She is a GYN patient.   OBJECTIVE:  There were no vitals taken for this visit.  Appears well, in no apparent distress  Injection administered in: Left deltoid  No orders of the defined types were placed in this encounter.   ASSESSMENT: GYN patient Depo Provera for contraception/period management PLAN: Follow-up: in 11-13 weeks for next Depo   Dwyane Dee  05/09/2020 4:34 PM

## 2020-08-01 ENCOUNTER — Ambulatory Visit: Payer: Medicaid Other

## 2020-08-02 ENCOUNTER — Ambulatory Visit: Payer: Medicaid Other

## 2020-08-08 ENCOUNTER — Ambulatory Visit: Payer: Medicaid Other

## 2020-08-20 IMAGING — CT CT ABDOMEN AND PELVIS WITH CONTRAST
2 of 4 series · 16 of 46 positions shown, 18 images · IV contrast (Isovue)
Comparison: Ultrasound 11/15/2018. CT 04/18/2016.

CLINICAL DATA: Lower abdominal pain on the right radiating to the
back. Pain over the last several months. Weight loss.

EXAM:
CT ABDOMEN AND PELVIS WITH CONTRAST
TECHNIQUE: Multidetector CT imaging of the abdomen and pelvis was performed
using the standard protocol following bolus administration of
intravenous contrast.
CONTRAST:  100mL OMNIPAQUE IOHEXOL 300 MG/ML  SOLN

[Series 2: axial st · axial · 0.64mm/px · z∈[+364,+790]mm · 13 of 93 slices shown, 15 images]
[im 4/93  soft-tissue]
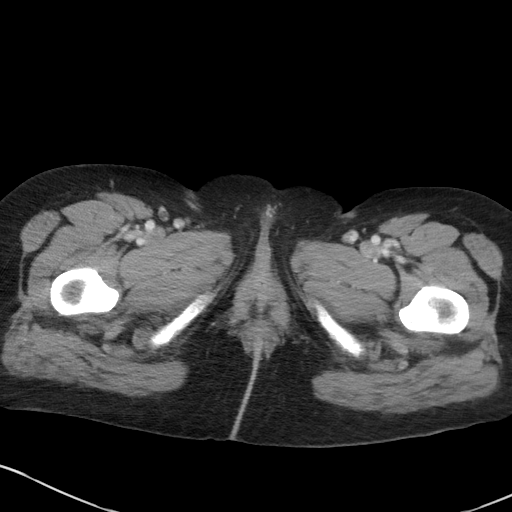
[im 4/93  bone]
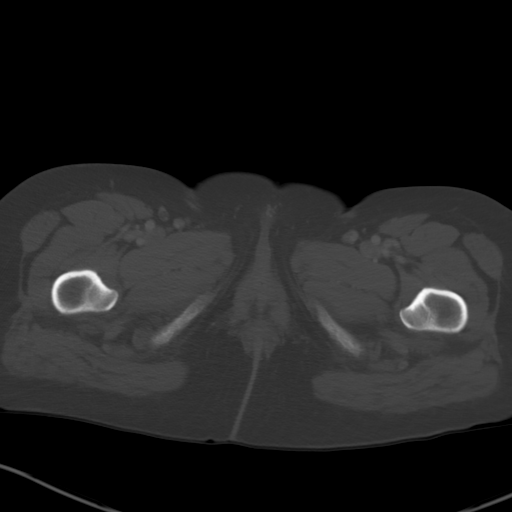
[im 12/93  soft-tissue]
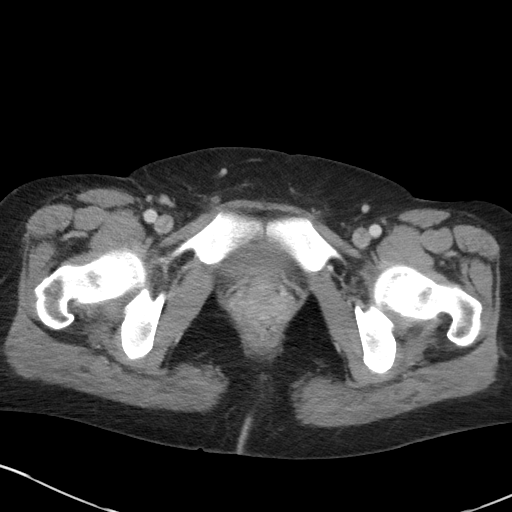
[im 19/93  soft-tissue]
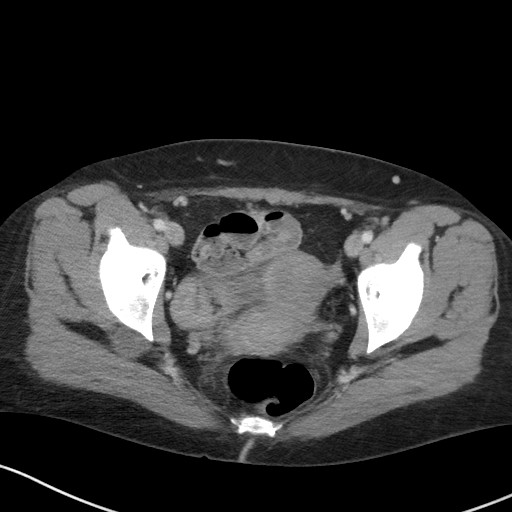
[im 26/93  soft-tissue]
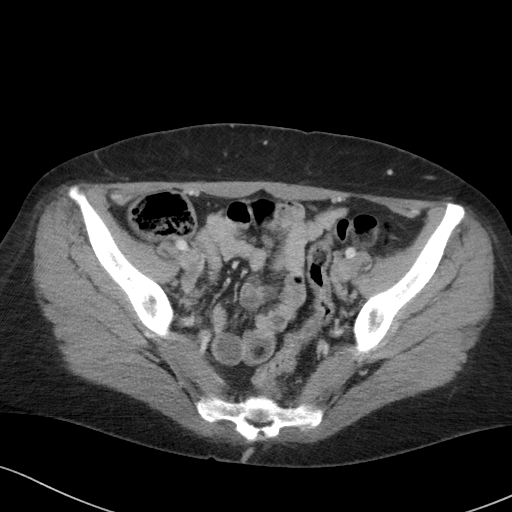
[im 34/93  soft-tissue]
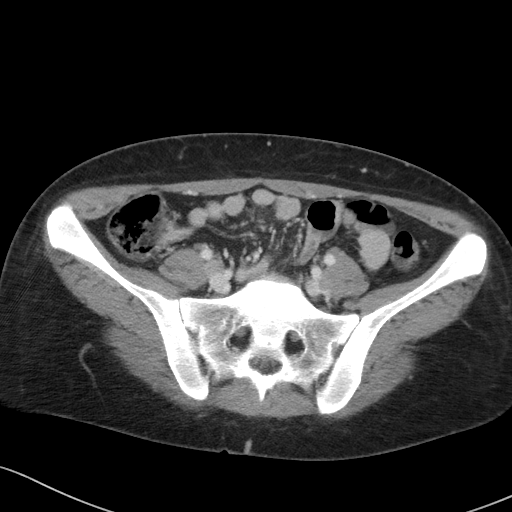
[im 41/93  soft-tissue]
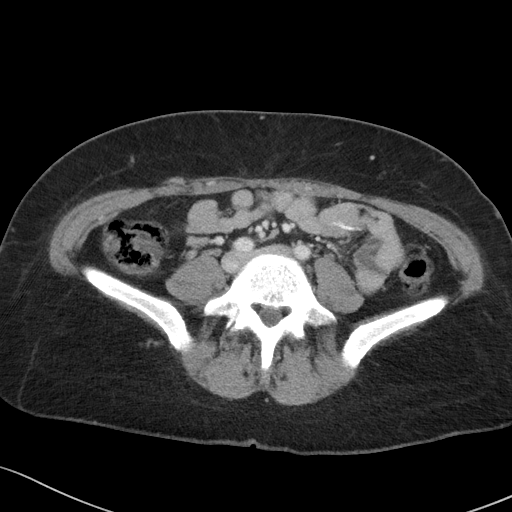
[im 48/93  soft-tissue]
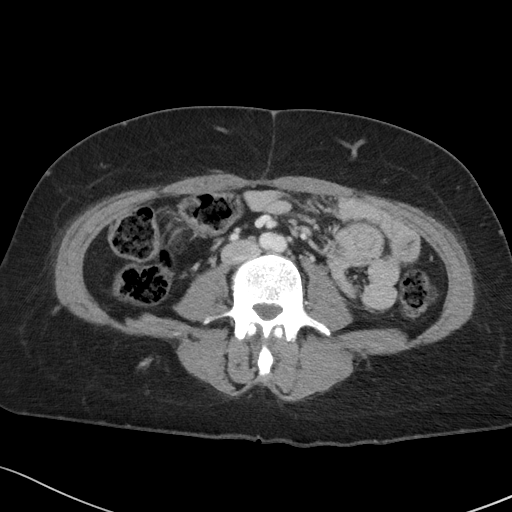
[im 52/93  soft-tissue]
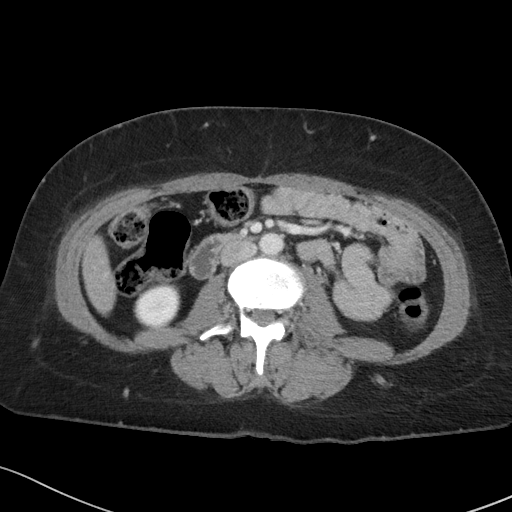
[im 59/93  soft-tissue]
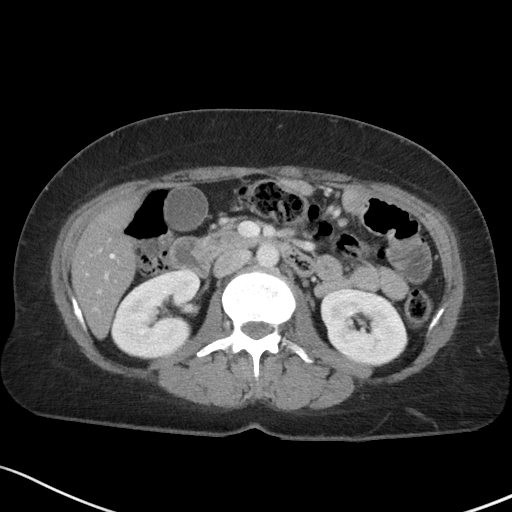
[im 59/93  bone]
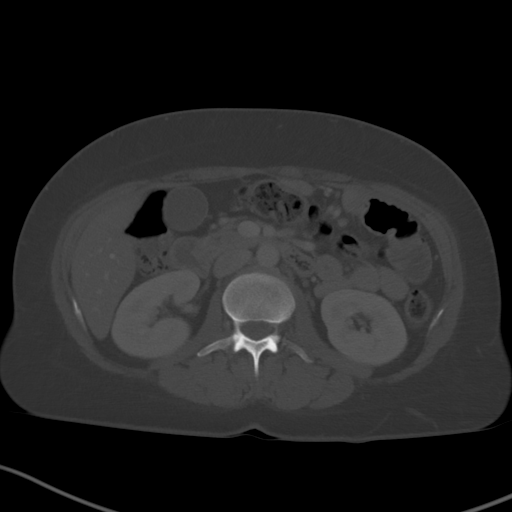
[im 67/93  soft-tissue]
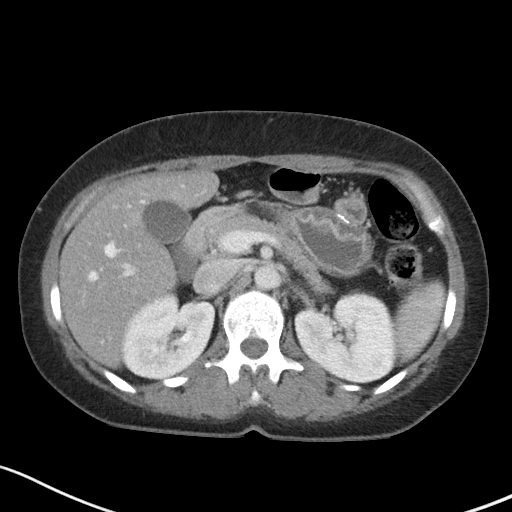
[im 74/93  soft-tissue]
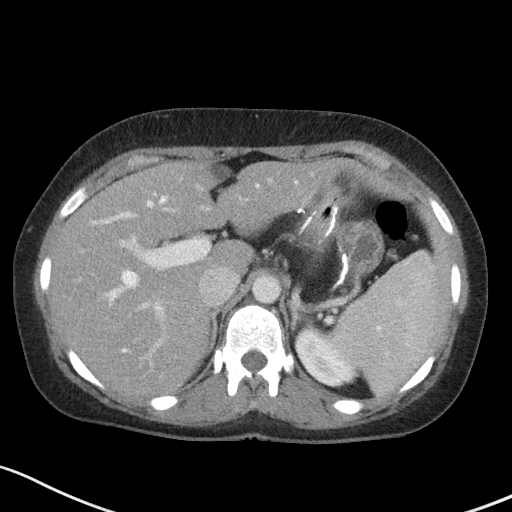
[im 81/93  soft-tissue]
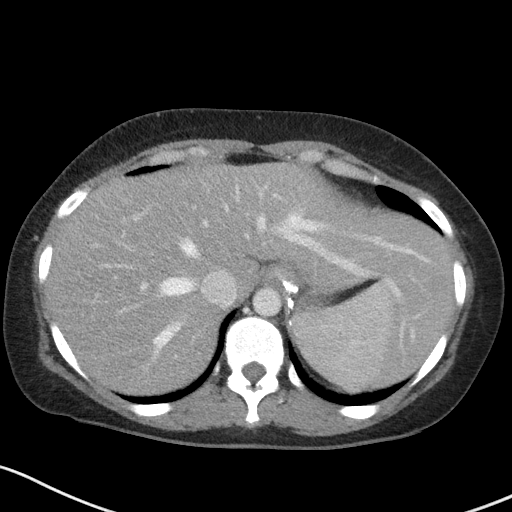
[im 89/93  soft-tissue]
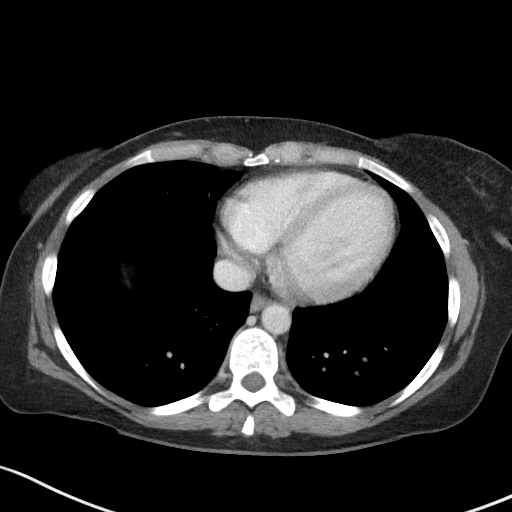

[Series 5: coronal st · coronal · 0.71mm/px · 3 of 75 slices shown]
[im 25/75  soft-tissue]
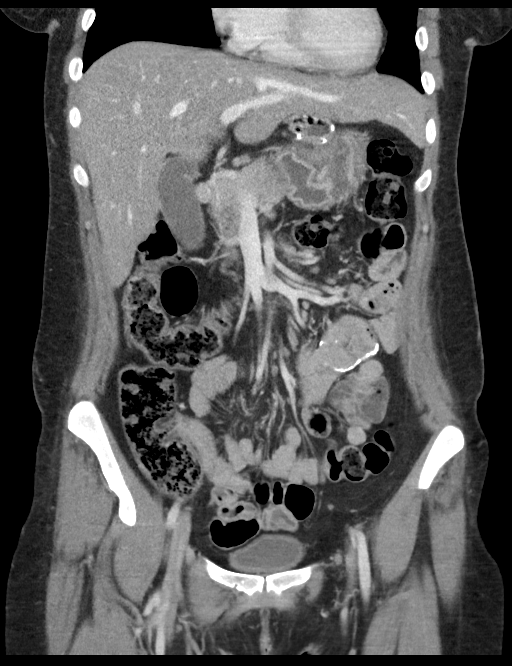
[im 33/75  soft-tissue]
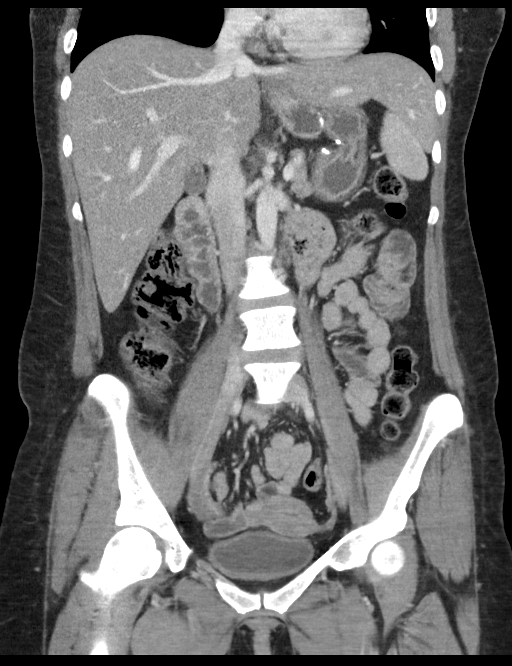
[im 42/75  soft-tissue]
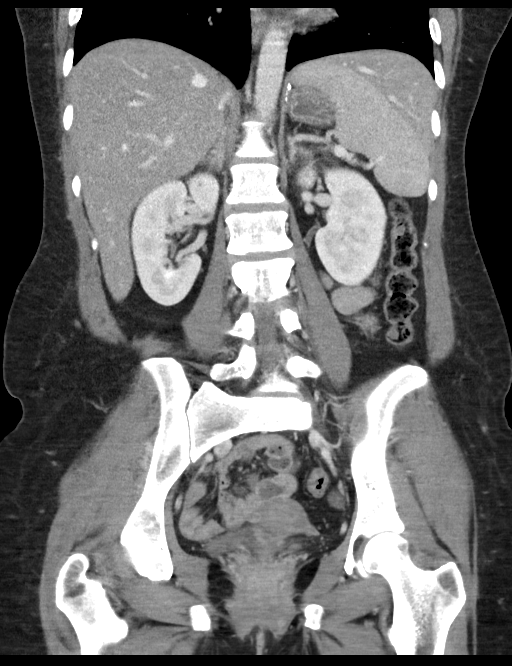

[16 of 46 positions shown; findings below may reference images not displayed]

FINDINGS: Lower chest: Normal

Hepatobiliary: Fatty change of the liver. This is more pronounced
adjacent to the false form ligament. No significant focal finding.
No calcified gallstones.

Pancreas: Normal

Spleen: Normal

Adrenals/Urinary Tract: Adrenal glands are normal. Kidneys are
normal. No cyst, mass or hydronephrosis. Can not rule out the
possibility of tiny bilateral renal calculi, though this could
relate to contrast excretion. Certainly there is no evidence of
passing stone. No stone in the bladder.

Stomach/Bowel: No acute bowel pathology. Evidence of bariatric
surgery. No sign of complication. I do not see an appendix. No
evidence bowel mass, inflammatory disease or obstruction.

Vascular/Lymphatic: Normal

Reproductive: Normal

Other: No free fluid or air.

Musculoskeletal: Normal
IMPRESSION: 1. Fatty change of the liver, more pronounced adjacent to the
falciform ligament. This could be a cause of upper abdominal pain.
2. Previous bariatric surgery without evidence of complication.
3. No sign of appendicitis.  Appendix is not visualized.
4. Possible small renal calculi but no evidence of obstruction or
passing stone.

## 2021-04-02 ENCOUNTER — Encounter (INDEPENDENT_AMBULATORY_CARE_PROVIDER_SITE_OTHER): Payer: Self-pay | Admitting: *Deleted

## 2021-08-12 ENCOUNTER — Other Ambulatory Visit: Payer: Self-pay

## 2021-08-12 ENCOUNTER — Ambulatory Visit (INDEPENDENT_AMBULATORY_CARE_PROVIDER_SITE_OTHER): Payer: Medicaid Other | Admitting: Gastroenterology

## 2021-08-12 ENCOUNTER — Encounter (INDEPENDENT_AMBULATORY_CARE_PROVIDER_SITE_OTHER): Payer: Self-pay | Admitting: Gastroenterology

## 2021-08-12 ENCOUNTER — Other Ambulatory Visit (INDEPENDENT_AMBULATORY_CARE_PROVIDER_SITE_OTHER): Payer: Self-pay

## 2021-08-12 DIAGNOSIS — R7989 Other specified abnormal findings of blood chemistry: Secondary | ICD-10-CM

## 2021-08-12 DIAGNOSIS — R1013 Epigastric pain: Secondary | ICD-10-CM | POA: Diagnosis not present

## 2021-08-12 DIAGNOSIS — Z79891 Long term (current) use of opiate analgesic: Secondary | ICD-10-CM | POA: Diagnosis not present

## 2021-08-12 DIAGNOSIS — G8929 Other chronic pain: Secondary | ICD-10-CM | POA: Insufficient documentation

## 2021-08-12 DIAGNOSIS — M6289 Other specified disorders of muscle: Secondary | ICD-10-CM | POA: Diagnosis not present

## 2021-08-12 DIAGNOSIS — I10 Essential (primary) hypertension: Secondary | ICD-10-CM

## 2021-08-12 NOTE — Patient Instructions (Addendum)
Schedule EGD Perform blood workup Follow up with physical therapist at [facility] regarding pelvic floor dysfunction Discussed with prescribing provider to switch lamotrigine to another medication, as this could be causing drug-induced liver injury -we will recheck CMP 3 months after stopping this medicine If persistent elevation in liver enzymes, will consider doing a liver biopsy Eat smaller portions during the day

## 2021-08-12 NOTE — H&P (View-Only) (Signed)
Kelsey Donovan, M.D. Gastroenterology & Hepatology [facility] [address] [place], [place] [address] Primary Care Physician: Medicine, [facility] [place] [address]  Referring MD: PCP  Chief Complaint:    History of Present Illness: Kelsey Donovan is a 30 y.o. female with past medical history of obesity status post Roux-en-Y gastric bypass, pituitary tumor, seizures, Ethelene Hal syndrome, who presents for evaluation of elevated liver enzymes, epigastric pain, abdominal fullness and diarrhea.  Patient reports that in 2014 she had her gastric bypass for obesity.  She states that after she had her surgery she was feeling well but states that in early 2020 she developed  new symptoms which have caused significant discomfort. She states having new onset recurrent nausea and vomiting in 2020 which was worsening and intractable. States she was hospitalized due to the severity of her vomiting. She was diagnosed with Guillian Barre syndrome during that hospitalization as she developed significant symptoms of paralysis.  Due to worsening abdominal pain and transient rise in her liver function test, she underwent cholecystectomy for suspected biliary dyskinesia on 10/2018. She states that she had persisting nausea since then without improvement after cholecystectomy but reports that she was able to tolerate her symtpoms while taking Zofran as needed.  More recently, she states she feels that after eating food she feels her food is stuck in her epigastric area. She does not have any vomiting but she feels constant frequent discomfort in her abdomen due to the pressure sensation. Also reports that for the last 6 months, she has had intermittent episodes of 5-6 watery bowel movements every day. She was seen at [facility] gastroenterology and she reports she had an anorectal manometry and was told she had pelvic floor dysfunction. It  seems the patient was following Dr. Danielle Rankin at [facility] but I am not able to review these records.  She has a listed diagnosis of pelvic floor dysfunction. Has an appointment scheduled on January 2023 with the pelvic floor specialist for further management.  Upon review of her medical chart, patient had blood testing on 09/20/2020 which showed a negative ASMA, negative double-stranded DNA, AMA.  She also had blood testing on 06/28/2021 that showed a negative acute hepatitis panel with negative hepatitis B surface antibody.  Her iron studies showed low iron of 21 with normal TIBC of 334 and a low saturation of 6%.  CBC showed a also count of 4.3, hemoglobin of 13.1 and a platelet count of 190.  Her most recent CMP on 05/24/2021 showed normal renal function electrolytes but her total bilirubin was normal 0.2, AST was elevated at 103 and ALT was mildly elevated at 59, alkaline phosphatase was 177.  Most recent abdominal imaging on 07/05/2021 showed findings consistent with hepatic steatosis but no other abnormalities.  The patient denies having any fever, chills, hematochezia, melena, hematemesis, jaundice, pruritus or weight loss.  Notably, she has been taking lamotrigine for management of bipolar disorder.  Takes hydrocodone every 6 hours for back pain.  Last EGD: Performed in 2020 while hospitalized at [facility], no report of the esophagogastroduodenospy is available but I was able to review EGD Biopsy results which showed Jejunum - negative for villus abnormality, stomach: mild chronic superficial gastritis, negative for H.pylori. Mild edema and small erosion of gastrojejunal anastomosis found.  Last Colonoscopy: Never  FHx: neg for any gastrointestinal/liver disease, mother ovarian cancer Social: smokes <1/2 pack a day but vapes, neg frequent alcohol or  illicit drug use Surgical: cholecystectomy and RYGB  Past Medical History: Past Medical History:  Diagnosis Date   Pituitary tumor     Renal disorder    kidney stones   Seizure ([place])    Thyroid disease     Past Surgical History: Past Surgical History:  Procedure Laterality Date   CHOLECYSTECTOMY     gastric bybpass     TONSILLECTOMY      Family History: Family History  Problem Relation Age of Onset   Hypertension Maternal Grandmother    Diabetes Maternal Grandmother    Diabetes Maternal Grandfather    Hypertension Maternal Grandfather    Cancer Mother        cervical   Hypertension Brother    Asthma Daughter    ADD / ADHD Daughter     Social History: Social History   Tobacco Use  Smoking Status Every Day   Packs/day: 0.25   Types: Cigarettes  Smokeless Tobacco Never   Social History   Substance and Sexual Activity  Alcohol Use Yes   Comment: occasionally   Social History   Substance and Sexual Activity  Drug Use No    Allergies: Allergies  Allergen Reactions   Ibuprofen Other (See Comments)    Pt told not to take Ibuprofen based pain medications due to having gastric bypass and "increased risk of thinning my blood".  Pt told not to take Ibuprofen based pain medications due to having gastric bypass and "increased risk of thinning my blood".     Medications: Current Outpatient Medications  Medication Sig Dispense Refill   amphetamine-dextroamphetamine (ADDERALL) 30 MG tablet Take 30 mg by mouth 2 (two) times daily.     ARIPiprazole (ABILIFY) 5 MG tablet Take 5 mg by mouth daily.     BACLOFEN PO Take by mouth.     budesonide-formoterol (SYMBICORT) 80-4.5 MCG/ACT inhaler Inhale 2 puffs into the lungs 2 (two) times daily as needed (shortness of breath).     cyanocobalamin (,VITAMIN B-12,) 1000 MCG/ML injection Inject 1,000 mcg into the muscle every 30 (thirty) days.     famotidine (PEPCID) 20 MG tablet Take 20 mg by mouth 2 (two) times daily.     ferrous gluconate (FERGON) 324 MG tablet Take 324 mg by mouth daily with breakfast.     fesoterodine (TOVIAZ) 8 MG TB24 tablet Take 8 mg by  mouth daily.     HYDROcodone-acetaminophen (NORCO/VICODIN) 5-325 MG tablet Take 1 tablet by mouth every 6 (six) hours as needed. 20 tablet 0   lamoTRIgine (LAMICTAL) 25 MG tablet Take 25 mg by mouth 2 (two) times daily. QHS     levothyroxine (SYNTHROID) 175 MCG tablet Take 175 mcg by mouth daily before breakfast.     omeprazole (PRILOSEC) 20 MG capsule Take 40 mg by mouth daily.     ondansetron (ZOFRAN ODT) 4 MG disintegrating tablet Take 1 tablet (4 mg total) by mouth every 8 (eight) hours as needed for nausea or vomiting. 20 tablet 0   OVER THE COUNTER MEDICATION Vitamin D daily     Prenatal Vit-Fe Fumarate-FA (PRENATAL MULTIVITAMIN) TABS tablet Take 1 tablet by mouth daily at 12 noon.     QUEtiapine (SEROQUEL) 300 MG tablet Take 300 mg by mouth at bedtime.     medroxyPROGESTERone (DEPO-PROVERA) 150 MG/ML injection ADMINISTER 1 ML(150 MG) IN THE MUSCLE EVERY 3 MONTHS (Patient not taking: Reported on 08/12/2021) 1 mL 4   No current facility-administered medications for this visit.    Review of Systems: GENERAL:  negative for malaise, night sweats HEENT: No changes in hearing or vision, no nose bleeds or other nasal problems. NECK: Negative for lumps, goiter, pain and significant neck swelling RESPIRATORY: Negative for cough, wheezing CARDIOVASCULAR: Negative for chest pain, leg swelling, palpitations, orthopnea GI: SEE HPI MUSCULOSKELETAL: Negative for joint pain or swelling, back pain, and muscle pain. SKIN: Negative for lesions, rash PSYCH: Negative for sleep disturbance, mood disorder and recent psychosocial stressors. HEMATOLOGY Negative for prolonged bleeding, bruising easily, and swollen nodes. ENDOCRINE: Negative for cold or heat intolerance, polyuria, polydipsia and goiter. NEURO: negative for tremor, gait imbalance, syncope and seizures. The remainder of the review of systems is noncontributory.   Physical Exam: BP 114/80 (BP Location: Left Arm, Patient Position: Sitting, Cuff  Size: Large)    Pulse 99    Temp 98.1 F (36.7 C) (Oral)    Ht 5\' 3"  (1.6 m)    Wt 187 lb 6.4 oz (85 kg)    BMI 33.20 kg/m  GENERAL: The patient is AO x3, in no acute distress. Obese.  HEENT: Head is normocephalic and atraumatic. EOMI are intact. Mouth is well hydrated and without lesions. NECK: Supple. No masses LUNGS: Clear to auscultation. No presence of rhonchi/wheezing/rales. Adequate chest expansion HEART: RRR, normal s1 and s2. ABDOMEN: tender upon palpation of the epigastric and periumbilical area, no guarding, no peritoneal signs, and nondistended. BS +. No masses. EXTREMITIES: Without any cyanosis, clubbing, rash, lesions or edema. NEUROLOGIC: AOx3, no focal motor deficit. Unsteady when walking. SKIN: no jaundice, no rashes   Imaging/Labs: as above  I personally reviewed and interpreted the available labs, imaging and endoscopic files.  Impression and Plan: Kelsey Donovan is a 30 y.o. female with past medical history of obesity status post Roux-en-Y gastric bypass, pituitary tumor, seizures, Ethelene Hal syndrome, who presents for evaluation of elevated liver enzymes, epigastric pain, abdominal fullness and diarrhea.  Patient has multiple complaints due to different etiologies.  She has presented mild elevation of her aminotransferases, predominantly the AST.  She had an extensive work-up to rule out metabolic, autoimmune or viral etiologies.  Even though she was found to have fatty liver which could account for some elevation of her aminotransferases, as the AST is a predominant elevated enzyme, it would be important to consider the possibility of drug-induced liver injury, specifically due to lamotrigine.  She will need to discuss with her prescribing provider to switch the medication to a different agent that does not have liver toxicity.  We will recheck her CMP in 3 months and if persistently elevated or worsening will consider doing a liver biopsy.  Her abdominal pain and  recurrent episodes of nausea have an unclear etiology.  They have been chronic and she has not presented any red flag signs yet.  We will explore this complaints further with an EGD and celiac serology.  I discussed with her that there was a possibility her symptoms are related to bowel hypersensitivity due to her Lonia Blood which may have led to increased sensitivity in her splanchnic innervation.  We will explore her dysphagia as well during her esophagogastroduodenospy.  I explained to her that part of dysphagia could be related to the chronic use of opiates leading to dysmotility.  In terms of her episode of diarrhea, she had an anorectal manometry that was suggestive of pelvic floor dysfunction.  This may be related to the neuromuscular sequela of the Lonia Blood syndrome and she will benefit from following at [facility] with the pelvic  floor therapist.  - Schedule EGD - Check celiac disease - Follow up with physical therapist at [facility] regarding pelvic floor dysfunction - Patient will discuss with prescribing provider to switch lamotrigine to another medication, as this could be causing drug-induced liver injury -we will recheck CMP 3 months after stopping this medicine - If persistent elevation in liver enzymes, will consider doing a liver biopsy - Eat smaller portions during the day  All questions were answered.      Kelsey Peppers, MD Gastroenterology and Hepatology [facility]

## 2021-08-12 NOTE — Progress Notes (Signed)
Kelsey Donovan, M.D. Gastroenterology & Hepatology [facility] [address] [place], [place] [address] Primary Care Physician: Medicine, [facility] [place] [address]  Referring MD: PCP  Chief Complaint:    History of Present Illness: Kelsey Donovan is a 30 y.o. female with past medical history of obesity status post Roux-en-Y gastric bypass, pituitary tumor, seizures, Ethelene Hal syndrome, who presents for evaluation of elevated liver enzymes, epigastric pain, abdominal fullness and diarrhea.  Patient reports that in 2014 she had her gastric bypass for obesity.  She states that after she had her surgery she was feeling well but states that in early 2020 she developed  new symptoms which have caused significant discomfort. She states having new onset recurrent nausea and vomiting in 2020 which was worsening and intractable. States she was hospitalized due to the severity of her vomiting. She was diagnosed with Guillian Barre syndrome during that hospitalization as she developed significant symptoms of paralysis.  Due to worsening abdominal pain and transient rise in her liver function test, she underwent cholecystectomy for suspected biliary dyskinesia on 10/2018. She states that she had persisting nausea since then without improvement after cholecystectomy but reports that she was able to tolerate her symtpoms while taking Zofran as needed.  More recently, she states she feels that after eating food she feels her food is stuck in her epigastric area. She does not have any vomiting but she feels constant frequent discomfort in her abdomen due to the pressure sensation. Also reports that for the last 6 months, she has had intermittent episodes of 5-6 watery bowel movements every day. She was seen at [facility] gastroenterology and she reports she had an anorectal manometry and was told she had pelvic floor dysfunction. It  seems the patient was following Dr. Danielle Rankin at [facility] but I am not able to review these records.  She has a listed diagnosis of pelvic floor dysfunction. Has an appointment scheduled on January 2023 with the pelvic floor specialist for further management.  Upon review of her medical chart, patient had blood testing on 09/20/2020 which showed a negative ASMA, negative double-stranded DNA, AMA.  She also had blood testing on 06/28/2021 that showed a negative acute hepatitis panel with negative hepatitis B surface antibody.  Her iron studies showed low iron of 21 with normal TIBC of 334 and a low saturation of 6%.  CBC showed a also count of 4.3, hemoglobin of 13.1 and a platelet count of 190.  Her most recent CMP on 05/24/2021 showed normal renal function electrolytes but her total bilirubin was normal 0.2, AST was elevated at 103 and ALT was mildly elevated at 59, alkaline phosphatase was 177.  Most recent abdominal imaging on 07/05/2021 showed findings consistent with hepatic steatosis but no other abnormalities.  The patient denies having any fever, chills, hematochezia, melena, hematemesis, jaundice, pruritus or weight loss.  Notably, she has been taking lamotrigine for management of bipolar disorder.  Takes hydrocodone every 6 hours for back pain.  Last EGD: Performed in 2020 while hospitalized at [facility], no report of the esophagogastroduodenospy is available but I was able to review EGD Biopsy results which showed Jejunum - negative for villus abnormality, stomach: mild chronic superficial gastritis, negative for H.pylori. Mild edema and small erosion of gastrojejunal anastomosis found.  Last Colonoscopy: Never  FHx: neg for any gastrointestinal/liver disease, mother ovarian cancer Social: smokes <1/2 pack a day but vapes, neg frequent alcohol or  illicit drug use Surgical: cholecystectomy and RYGB  Past Medical History: Past Medical History:  Diagnosis Date   Pituitary tumor     Renal disorder    kidney stones   Seizure ([place])    Thyroid disease     Past Surgical History: Past Surgical History:  Procedure Laterality Date   CHOLECYSTECTOMY     gastric bybpass     TONSILLECTOMY      Family History: Family History  Problem Relation Age of Onset   Hypertension Maternal Grandmother    Diabetes Maternal Grandmother    Diabetes Maternal Grandfather    Hypertension Maternal Grandfather    Cancer Mother        cervical   Hypertension Brother    Asthma Daughter    ADD / ADHD Daughter     Social History: Social History   Tobacco Use  Smoking Status Every Day   Packs/day: 0.25   Types: Cigarettes  Smokeless Tobacco Never   Social History   Substance and Sexual Activity  Alcohol Use Yes   Comment: occasionally   Social History   Substance and Sexual Activity  Drug Use No    Allergies: Allergies  Allergen Reactions   Ibuprofen Other (See Comments)    Pt told not to take Ibuprofen based pain medications due to having gastric bypass and "increased risk of thinning my blood".  Pt told not to take Ibuprofen based pain medications due to having gastric bypass and "increased risk of thinning my blood".     Medications: Current Outpatient Medications  Medication Sig Dispense Refill   amphetamine-dextroamphetamine (ADDERALL) 30 MG tablet Take 30 mg by mouth 2 (two) times daily.     ARIPiprazole (ABILIFY) 5 MG tablet Take 5 mg by mouth daily.     BACLOFEN PO Take by mouth.     budesonide-formoterol (SYMBICORT) 80-4.5 MCG/ACT inhaler Inhale 2 puffs into the lungs 2 (two) times daily as needed (shortness of breath).     cyanocobalamin (,VITAMIN B-12,) 1000 MCG/ML injection Inject 1,000 mcg into the muscle every 30 (thirty) days.     famotidine (PEPCID) 20 MG tablet Take 20 mg by mouth 2 (two) times daily.     ferrous gluconate (FERGON) 324 MG tablet Take 324 mg by mouth daily with breakfast.     fesoterodine (TOVIAZ) 8 MG TB24 tablet Take 8 mg by  mouth daily.     HYDROcodone-acetaminophen (NORCO/VICODIN) 5-325 MG tablet Take 1 tablet by mouth every 6 (six) hours as needed. 20 tablet 0   lamoTRIgine (LAMICTAL) 25 MG tablet Take 25 mg by mouth 2 (two) times daily. QHS     levothyroxine (SYNTHROID) 175 MCG tablet Take 175 mcg by mouth daily before breakfast.     omeprazole (PRILOSEC) 20 MG capsule Take 40 mg by mouth daily.     ondansetron (ZOFRAN ODT) 4 MG disintegrating tablet Take 1 tablet (4 mg total) by mouth every 8 (eight) hours as needed for nausea or vomiting. 20 tablet 0   OVER THE COUNTER MEDICATION Vitamin D daily     Prenatal Vit-Fe Fumarate-FA (PRENATAL MULTIVITAMIN) TABS tablet Take 1 tablet by mouth daily at 12 noon.     QUEtiapine (SEROQUEL) 300 MG tablet Take 300 mg by mouth at bedtime.     medroxyPROGESTERone (DEPO-PROVERA) 150 MG/ML injection ADMINISTER 1 ML(150 MG) IN THE MUSCLE EVERY 3 MONTHS (Patient not taking: Reported on 08/12/2021) 1 mL 4   No current facility-administered medications for this visit.    Review of Systems: GENERAL:  negative for malaise, night sweats HEENT: No changes in hearing or vision, no nose bleeds or other nasal problems. NECK: Negative for lumps, goiter, pain and significant neck swelling RESPIRATORY: Negative for cough, wheezing CARDIOVASCULAR: Negative for chest pain, leg swelling, palpitations, orthopnea GI: SEE HPI MUSCULOSKELETAL: Negative for joint pain or swelling, back pain, and muscle pain. SKIN: Negative for lesions, rash PSYCH: Negative for sleep disturbance, mood disorder and recent psychosocial stressors. HEMATOLOGY Negative for prolonged bleeding, bruising easily, and swollen nodes. ENDOCRINE: Negative for cold or heat intolerance, polyuria, polydipsia and goiter. NEURO: negative for tremor, gait imbalance, syncope and seizures. The remainder of the review of systems is noncontributory.   Physical Exam: BP 114/80 (BP Location: Left Arm, Patient Position: Sitting, Cuff  Size: Large)   Pulse 99   Temp 98.1 F (36.7 C) (Oral)   Ht 5\' 3"  (1.6 m)   Wt 187 lb 6.4 oz (85 kg)   BMI 33.20 kg/m  GENERAL: The patient is AO x3, in no acute distress. Obese.  HEENT: Head is normocephalic and atraumatic. EOMI are intact. Mouth is well hydrated and without lesions. NECK: Supple. No masses LUNGS: Clear to auscultation. No presence of rhonchi/wheezing/rales. Adequate chest expansion HEART: RRR, normal s1 and s2. ABDOMEN: tender upon palpation of the epigastric and periumbilical area, no guarding, no peritoneal signs, and nondistended. BS +. No masses. EXTREMITIES: Without any cyanosis, clubbing, rash, lesions or edema. NEUROLOGIC: AOx3, no focal motor deficit. Unsteady when walking. SKIN: no jaundice, no rashes   Imaging/Labs: as above  I personally reviewed and interpreted the available labs, imaging and endoscopic files.  Impression and Plan: Kelsey Donovan is a 30 y.o. female with past medical history of obesity status post Roux-en-Y gastric bypass, pituitary tumor, seizures, Ethelene Hal syndrome, who presents for evaluation of elevated liver enzymes, epigastric pain, abdominal fullness and diarrhea.  Patient has multiple complaints due to different etiologies.  She has presented mild elevation of her aminotransferases, predominantly the AST.  She had an extensive work-up to rule out metabolic, autoimmune or viral etiologies.  Even though she was found to have fatty liver which could account for some elevation of her aminotransferases, as the AST is a predominant elevated enzyme, it would be important to consider the possibility of drug-induced liver injury, specifically due to lamotrigine.  She will need to discuss with her prescribing provider to switch the medication to a different agent that does not have liver toxicity.  We will recheck her CMP in 3 months and if persistently elevated or worsening will consider doing a liver biopsy.  Her abdominal pain and  recurrent episodes of nausea have an unclear etiology.  They have been chronic and she has not presented any red flag signs yet.  We will explore this complaints further with an EGD and celiac serology.  I discussed with her that there was a possibility her symptoms are related to bowel hypersensitivity due to her Lonia Blood which may have led to increased sensitivity in her splanchnic innervation.  We will explore her dysphagia as well during her esophagogastroduodenospy.  I explained to her that part of dysphagia could be related to the chronic use of opiates leading to dysmotility.  In terms of her episode of diarrhea, she had an anorectal manometry that was suggestive of pelvic floor dysfunction.  This may be related to the neuromuscular sequela of the Lonia Blood syndrome and she will benefit from following at [facility] with the pelvic floor therapist.  - Schedule  EGD - Check celiac disease - Follow up with physical therapist at [facility] regarding pelvic floor dysfunction - Patient will discuss with prescribing provider to switch lamotrigine to another medication, as this could be causing drug-induced liver injury -we will recheck CMP 3 months after stopping this medicine - If persistent elevation in liver enzymes, will consider doing a liver biopsy - Eat smaller portions during the day  All questions were answered.      Kelsey Peppers, MD Gastroenterology and Hepatology [facility]

## 2021-08-13 LAB — CELIAC DISEASE PANEL
(tTG) Ab, IgA: <1 U/mL
(tTG) Ab, IgG: <1 U/mL
Gliadin IgA: 1.4 U/mL
Gliadin IgG: <1 U/mL
Immunoglobulin A: 316 mg/dL — ABNORMAL HIGH (ref 47–310)

## 2021-08-16 ENCOUNTER — Other Ambulatory Visit (INDEPENDENT_AMBULATORY_CARE_PROVIDER_SITE_OTHER): Payer: Self-pay

## 2021-08-16 ENCOUNTER — Encounter (INDEPENDENT_AMBULATORY_CARE_PROVIDER_SITE_OTHER): Payer: Self-pay

## 2021-08-16 DIAGNOSIS — Z01812 Encounter for preprocedural laboratory examination: Secondary | ICD-10-CM

## 2021-08-16 DIAGNOSIS — N926 Irregular menstruation, unspecified: Secondary | ICD-10-CM

## 2021-09-03 ENCOUNTER — Other Ambulatory Visit (HOSPITAL_COMMUNITY)
Admission: RE | Admit: 2021-09-03 | Discharge: 2021-09-03 | Disposition: A | Discharge status: Home / Self Care | Payer: Medicaid Other | Source: Ambulatory Visit | Attending: Gastroenterology | Admitting: Gastroenterology

## 2021-09-03 ENCOUNTER — Encounter (INDEPENDENT_AMBULATORY_CARE_PROVIDER_SITE_OTHER): Payer: Self-pay | Admitting: Gastroenterology

## 2021-09-03 DIAGNOSIS — N926 Irregular menstruation, unspecified: Secondary | ICD-10-CM | POA: Diagnosis present

## 2021-09-03 DIAGNOSIS — Z01812 Encounter for preprocedural laboratory examination: Secondary | ICD-10-CM

## 2021-09-03 DIAGNOSIS — F17290 Nicotine dependence, other tobacco product, uncomplicated: Secondary | ICD-10-CM | POA: Diagnosis not present

## 2021-09-03 DIAGNOSIS — Z9884 Bariatric surgery status: Secondary | ICD-10-CM | POA: Diagnosis not present

## 2021-09-03 DIAGNOSIS — Z6833 Body mass index (BMI) 33.0-33.9, adult: Secondary | ICD-10-CM | POA: Diagnosis not present

## 2021-09-03 DIAGNOSIS — Z79891 Long term (current) use of opiate analgesic: Secondary | ICD-10-CM | POA: Diagnosis not present

## 2021-09-03 DIAGNOSIS — R112 Nausea with vomiting, unspecified: Secondary | ICD-10-CM | POA: Diagnosis not present

## 2021-09-03 DIAGNOSIS — F319 Bipolar disorder, unspecified: Secondary | ICD-10-CM | POA: Diagnosis not present

## 2021-09-03 DIAGNOSIS — Z9049 Acquired absence of other specified parts of digestive tract: Secondary | ICD-10-CM | POA: Diagnosis not present

## 2021-09-03 DIAGNOSIS — K3189 Other diseases of stomach and duodenum: Secondary | ICD-10-CM | POA: Diagnosis not present

## 2021-09-03 DIAGNOSIS — E669 Obesity, unspecified: Secondary | ICD-10-CM | POA: Diagnosis not present

## 2021-09-03 DIAGNOSIS — R109 Unspecified abdominal pain: Secondary | ICD-10-CM | POA: Diagnosis present

## 2021-09-03 DIAGNOSIS — I10 Essential (primary) hypertension: Secondary | ICD-10-CM | POA: Diagnosis present

## 2021-09-03 DIAGNOSIS — Z79899 Other long term (current) drug therapy: Secondary | ICD-10-CM | POA: Diagnosis not present

## 2021-09-03 DIAGNOSIS — F17210 Nicotine dependence, cigarettes, uncomplicated: Secondary | ICD-10-CM | POA: Diagnosis not present

## 2021-09-03 DIAGNOSIS — K2950 Unspecified chronic gastritis without bleeding: Secondary | ICD-10-CM | POA: Diagnosis not present

## 2021-09-03 DIAGNOSIS — M549 Dorsalgia, unspecified: Secondary | ICD-10-CM | POA: Diagnosis not present

## 2021-09-03 LAB — BASIC METABOLIC PANEL
Anion gap: 9 (ref 5–15)
BUN: <5 mg/dL — ABNORMAL LOW (ref 6–20)
CO2: 28 mmol/L (ref 22–32)
Calcium: 8.9 mg/dL (ref 8.9–10.3)
Chloride: 97 mmol/L — ABNORMAL LOW (ref 98–111)
Creatinine, Ser: 0.63 mg/dL (ref 0.44–1.00)
GFR, Estimated: >60 mL/min (ref >60)
Glucose, Bld: 109 mg/dL — ABNORMAL HIGH (ref 70–99)
Potassium: 3.3 mmol/L — ABNORMAL LOW (ref 3.5–5.1)
Sodium: 134 mmol/L — ABNORMAL LOW (ref 135–145)

## 2021-09-03 LAB — HCG, SERUM, QUALITATIVE: Preg, Serum: NEGATIVE

## 2021-09-04 ENCOUNTER — Encounter (HOSPITAL_COMMUNITY): Payer: Self-pay | Admitting: Gastroenterology

## 2021-09-04 ENCOUNTER — Encounter (HOSPITAL_COMMUNITY)
Admission: RE | Disposition: A | Discharge status: Home / Self Care | Payer: Self-pay | Source: Home / Self Care | Attending: Gastroenterology

## 2021-09-04 ENCOUNTER — Other Ambulatory Visit: Payer: Self-pay

## 2021-09-04 ENCOUNTER — Ambulatory Visit (HOSPITAL_COMMUNITY): Payer: Medicare Other | Admitting: Certified Registered"

## 2021-09-04 ENCOUNTER — Ambulatory Visit (HOSPITAL_COMMUNITY)
Admission: RE | Admit: 2021-09-04 | Discharge: 2021-09-04 | Disposition: A | Discharge status: Home / Self Care | Payer: Medicare Other | Attending: Gastroenterology | Admitting: Gastroenterology

## 2021-09-04 ENCOUNTER — Other Ambulatory Visit (INDEPENDENT_AMBULATORY_CARE_PROVIDER_SITE_OTHER): Payer: Self-pay | Admitting: Gastroenterology

## 2021-09-04 DIAGNOSIS — R109 Unspecified abdominal pain: Secondary | ICD-10-CM | POA: Diagnosis not present

## 2021-09-04 DIAGNOSIS — K9589 Other complications of other bariatric procedure: Secondary | ICD-10-CM | POA: Diagnosis not present

## 2021-09-04 DIAGNOSIS — Z6833 Body mass index (BMI) 33.0-33.9, adult: Secondary | ICD-10-CM | POA: Insufficient documentation

## 2021-09-04 DIAGNOSIS — K3189 Other diseases of stomach and duodenum: Secondary | ICD-10-CM | POA: Diagnosis not present

## 2021-09-04 DIAGNOSIS — E669 Obesity, unspecified: Secondary | ICD-10-CM | POA: Insufficient documentation

## 2021-09-04 DIAGNOSIS — F319 Bipolar disorder, unspecified: Secondary | ICD-10-CM | POA: Insufficient documentation

## 2021-09-04 DIAGNOSIS — R112 Nausea with vomiting, unspecified: Secondary | ICD-10-CM | POA: Insufficient documentation

## 2021-09-04 DIAGNOSIS — Z980 Intestinal bypass and anastomosis status: Secondary | ICD-10-CM

## 2021-09-04 DIAGNOSIS — Z9884 Bariatric surgery status: Secondary | ICD-10-CM | POA: Diagnosis not present

## 2021-09-04 DIAGNOSIS — R110 Nausea: Secondary | ICD-10-CM | POA: Diagnosis not present

## 2021-09-04 DIAGNOSIS — D539 Nutritional anemia, unspecified: Secondary | ICD-10-CM

## 2021-09-04 DIAGNOSIS — G8929 Other chronic pain: Secondary | ICD-10-CM

## 2021-09-04 DIAGNOSIS — F17290 Nicotine dependence, other tobacco product, uncomplicated: Secondary | ICD-10-CM | POA: Insufficient documentation

## 2021-09-04 DIAGNOSIS — F17210 Nicotine dependence, cigarettes, uncomplicated: Secondary | ICD-10-CM | POA: Insufficient documentation

## 2021-09-04 DIAGNOSIS — Z79899 Other long term (current) drug therapy: Secondary | ICD-10-CM | POA: Insufficient documentation

## 2021-09-04 DIAGNOSIS — Z79891 Long term (current) use of opiate analgesic: Secondary | ICD-10-CM | POA: Insufficient documentation

## 2021-09-04 DIAGNOSIS — M549 Dorsalgia, unspecified: Secondary | ICD-10-CM | POA: Insufficient documentation

## 2021-09-04 DIAGNOSIS — Z9049 Acquired absence of other specified parts of digestive tract: Secondary | ICD-10-CM | POA: Insufficient documentation

## 2021-09-04 DIAGNOSIS — E538 Deficiency of other specified B group vitamins: Secondary | ICD-10-CM

## 2021-09-04 DIAGNOSIS — K2950 Unspecified chronic gastritis without bleeding: Secondary | ICD-10-CM | POA: Insufficient documentation

## 2021-09-04 HISTORY — PX: BIOPSY: SHX5522

## 2021-09-04 HISTORY — PX: ESOPHAGOGASTRODUODENOSCOPY (EGD) WITH PROPOFOL: SHX5813

## 2021-09-04 HISTORY — PX: ESOPHAGEAL DILATION: SHX303

## 2021-09-04 LAB — IRON AND TIBC
Iron: 175 ug/dL — ABNORMAL HIGH (ref 28–170)
Saturation Ratios: 68 % — ABNORMAL HIGH (ref 10.4–31.8)
TIBC: 258 ug/dL (ref 250–450)
UIBC: 83 ug/dL

## 2021-09-04 LAB — CBC
HCT: 34.2 % — ABNORMAL LOW (ref 36.0–46.0)
Hemoglobin: 11.8 g/dL — ABNORMAL LOW (ref 12.0–15.0)
MCH: 37.3 pg — ABNORMAL HIGH (ref 26.0–34.0)
MCHC: 34.5 g/dL (ref 30.0–36.0)
MCV: 108.2 fL — ABNORMAL HIGH (ref 80.0–100.0)
Platelets: 174 10*3/uL (ref 150–400)
RBC: 3.16 MIL/uL — ABNORMAL LOW (ref 3.87–5.11)
RDW: 21.1 % — ABNORMAL HIGH (ref 11.5–15.5)
WBC: 3.1 10*3/uL — ABNORMAL LOW (ref 4.0–10.5)
nRBC: 0 % (ref 0.0–0.2)

## 2021-09-04 LAB — VITAMIN B12: Vitamin B-12: 88 pg/mL — ABNORMAL LOW (ref 180–914)

## 2021-09-04 LAB — FOLATE: Folate: 1.4 ng/mL — ABNORMAL LOW (ref >5.9)

## 2021-09-04 LAB — FERRITIN: Ferritin: 70 ng/mL (ref 11–307)

## 2021-09-04 SURGERY — ESOPHAGOGASTRODUODENOSCOPY (EGD) WITH PROPOFOL
Anesthesia: General

## 2021-09-04 MED ORDER — LACTATED RINGERS IV SOLN: INTRAVENOUS | Status: DC

## 2021-09-04 MED ORDER — VITAMIN B-12 1000 MCG PO TABS: 1000.0000 ug | ORAL_TABLET | Freq: Every day | ORAL | 0 refills | Status: AC

## 2021-09-04 MED ORDER — DEXMEDETOMIDINE (PRECEDEX) IN NS 20 MCG/5ML (4 MCG/ML) IV SYRINGE
PREFILLED_SYRINGE | INTRAVENOUS | Status: AC
  Filled 2021-09-04: qty 5

## 2021-09-04 MED ORDER — DICYCLOMINE HCL 10 MG PO CAPS
10.0000 mg | ORAL_CAPSULE | Freq: Two times a day (BID) | ORAL | 5 refills | Status: AC | PRN
Start: 2021-09-04 — End: ?

## 2021-09-04 MED ORDER — PROPOFOL 10 MG/ML IV BOLUS
INTRAVENOUS | Status: DC | PRN
  Administered 2021-09-04: 100 mg via INTRAVENOUS
  Administered 2021-09-04: 50 mg via INTRAVENOUS
  Administered 2021-09-04: 70 mg via INTRAVENOUS
  Administered 2021-09-04: 50 mg via INTRAVENOUS

## 2021-09-04 MED ORDER — PHENYLEPHRINE 40 MCG/ML (10ML) SYRINGE FOR IV PUSH (FOR BLOOD PRESSURE SUPPORT)
PREFILLED_SYRINGE | INTRAVENOUS | Status: AC
  Filled 2021-09-04: qty 10

## 2021-09-04 MED ORDER — PROPOFOL 500 MG/50ML IV EMUL
INTRAVENOUS | Status: DC | PRN
  Administered 2021-09-04: 150 ug/kg/min via INTRAVENOUS

## 2021-09-04 MED ORDER — FOLIC ACID 1 MG PO TABS: 1.0000 mg | ORAL_TABLET | Freq: Every day | ORAL | 0 refills | Status: DC

## 2021-09-04 MED ORDER — PHENYLEPHRINE 40 MCG/ML (10ML) SYRINGE FOR IV PUSH (FOR BLOOD PRESSURE SUPPORT)
PREFILLED_SYRINGE | INTRAVENOUS | Status: DC | PRN
  Administered 2021-09-04: 80 ug via INTRAVENOUS

## 2021-09-04 MED ORDER — DEXMEDETOMIDINE (PRECEDEX) IN NS 20 MCG/5ML (4 MCG/ML) IV SYRINGE
PREFILLED_SYRINGE | INTRAVENOUS | Status: DC | PRN
  Administered 2021-09-04 (×2): 10 ug via INTRAVENOUS

## 2021-09-04 MED ORDER — LIDOCAINE HCL (CARDIAC) PF 100 MG/5ML IV SOSY
PREFILLED_SYRINGE | INTRAVENOUS | Status: DC | PRN
  Administered 2021-09-04: 100 mg via INTRAVENOUS

## 2021-09-04 NOTE — Interval H&P Note (Signed)
History and Physical Interval Note:  09/04/2021 11:36 AM  Kelsey Donovan  has presented today for surgery, with the diagnosis of Epigastric Pain.  The various methods of treatment have been discussed with the patient and family. After consideration of risks, benefits and other options for treatment, the patient has consented to  Procedure(s) with comments: ESOPHAGOGASTRODUODENOSCOPY (EGD) WITH PROPOFOL (N/A) - 1:15 as a surgical intervention.  The patient's history has been reviewed, patient examined, no change in status, stable for surgery.  I have reviewed the patient's chart and labs.  Questions were answered to the patient's satisfaction.     [name]

## 2021-09-04 NOTE — Anesthesia Procedure Notes (Signed)
Date/Time: 09/04/2021 11:52 AM Performed by: Orlie Dakin, CRNA Pre-anesthesia Checklist: Patient identified, Emergency Drugs available, Suction available and Patient being monitored Patient Re-evaluated:Patient Re-evaluated prior to induction Oxygen Delivery Method: Nasal cannula Induction Type: IV induction Placement Confirmation: positive ETCO2

## 2021-09-04 NOTE — Discharge Instructions (Addendum)
You are being discharged to home.  Resume your previous diet.  We are waiting for your pathology results.  Can take Bentyl as needed if significant abdominal pain.

## 2021-09-04 NOTE — Transfer of Care (Signed)
Immediate Anesthesia Transfer of Care Note  Patient: Kelsey Donovan  Procedure(s) Performed: ESOPHAGOGASTRODUODENOSCOPY (EGD) WITH PROPOFOL BIOPSY ESOPHAGEAL DILATION  Patient Location: Endoscopy Unit  Anesthesia Type:General  Level of Consciousness: drowsy  Airway & Oxygen Therapy: Patient Spontanous Breathing  Post-op Assessment: Report given to RN and Post -op Vital signs reviewed and stable  Post vital signs: Reviewed and stable  Last Vitals:  Vitals Value Taken Time  BP    Temp    Pulse    Resp    SpO2      Last Pain:  Vitals:   09/04/21 1136  TempSrc: Oral  PainSc: 0-No pain      Patients Stated Pain Goal: 7 (27/80/04 4715)  Complications: No notable events documented.

## 2021-09-04 NOTE — Anesthesia Preprocedure Evaluation (Addendum)
Anesthesia Evaluation  Patient identified by MRN, date of birth, ID band Patient awake    Reviewed: Allergy & Precautions, H&P , NPO status , Patient's Chart, lab work & pertinent test results, reviewed documented beta blocker date and time   Airway Mallampati: II  TM Distance: >3 FB Neck ROM: full    Dental  (+) Edentulous Upper, Edentulous Lower   Pulmonary neg pulmonary ROS, Current Smoker,    Pulmonary exam normal breath sounds clear to auscultation       Cardiovascular Exercise Tolerance: Good negative cardio ROS   Rhythm:regular Rate:Normal     Neuro/Psych Seizures -,   Neuromuscular disease negative psych ROS   GI/Hepatic negative GI ROS, Neg liver ROS,   Endo/Other  negative endocrine ROS  Renal/GU negative Renal ROS  negative genitourinary   Musculoskeletal   Abdominal   Peds  Hematology negative hematology ROS (+)   Anesthesia Other Findings   Reproductive/Obstetrics negative OB ROS                            Anesthesia Physical Anesthesia Plan  ASA: 2  Anesthesia Plan: General   Post-op Pain Management:    Induction:   PONV Risk Score and Plan: Propofol infusion  Airway Management Planned:   Additional Equipment:   Intra-op Plan:   Post-operative Plan:   Informed Consent: I have reviewed the patients History and Physical, chart, labs and discussed the procedure including the risks, benefits and alternatives for the proposed anesthesia with the patient or authorized representative who has indicated his/her understanding and acceptance.     Dental Advisory Given  Plan Discussed with: CRNA  Anesthesia Plan Comments:         Anesthesia Quick Evaluation

## 2021-09-04 NOTE — Op Note (Signed)
[facility] Patient Name: Kelsey Donovan Procedure Date: 09/04/2021 11:26 AM MRN: [id number] Date of Birth: 10/26/1990 Attending MD: [name] ,  CSN: [id number] Age: 31 Admit Type: Outpatient Procedure:                Upper GI endoscopy Indications:              Abdominal pain, Nausea Providers:                [name], Janeece Riggers, RN, Hughie Closs,                            RN, Randa Spike, Technician Referring MD:              Medicines:                Monitored Anesthesia Care Complications:            No immediate complications. Estimated Blood Loss:     Estimated blood loss: none. Procedure:                Pre-Anesthesia Assessment:                           - Prior to the procedure, a History and Physical                            was performed, and patient medications, allergies                            and sensitivities were reviewed. The patient's                            tolerance of previous anesthesia was reviewed.                           - The risks and benefits of the procedure and the                            sedation options and risks were discussed with the                            patient. All questions were answered and informed                            consent was obtained.                           - ASA Grade Assessment: III - A patient with severe                            systemic disease.                           After obtaining informed consent, the endoscope was                            passed under direct vision. Throughout the  procedure, the patient's blood pressure, pulse, and                            oxygen saturations were monitored continuously. The                            GIF-H190 ([id number]) scope was introduced through the                            mouth, and advanced to the jejunum. The upper GI                            endoscopy was accomplished without difficulty. The                             patient tolerated the procedure well. Scope In: 11:49:05 AM Scope Out: 12:01:35 PM Total Procedure Duration: 0 hours 12 minutes 30 seconds  Findings:      The examined esophagus was normal.      Evidence of a gastric bypass was found. A gastric pouch with a 8 cm       length from the GE junction to the gastrojejunal anastomosis was found.       The staple line appeared intact. The gastrojejunal anastomosis was       characterized by mild stenosis and stenosis 10 cm (inner diameter) x 0.5       cm (length). This was traversed. The pouch-to-jejunum limb was       characterized by healthy appearing mucosa. The duodenum-to-jejunum limb       was examined. Biopsies were taken with a cold forceps for Helicobacter       pylori testing. A TTS dilator was passed through the scope. Dilation       with a 12-13.5-15 mm pyloric balloon dilator was performed, max dilation       15 mm with mild mucosal disruption.      The examined jejunum was normal. Impression:               - Normal esophagus.                           - Gastric bypass with a pouch 8 cm in length and                            intact staple line. Gastrojejunal anastomosis                            characterized by stenosis and mild stenosis.                            Biopsied. Dilated.                           - Normal examined jejunum. Moderate Sedation:      Per Anesthesia Care Recommendation:           - Discharge patient to home (ambulatory).                           -  Resume previous diet.                           - Await pathology results.                           - Can take Bentyl as needed if significant                            abdominal pain. Procedure Code(s):        --- Professional ---                           [phone], Esophagogastroduodenoscopy, flexible,                            transoral; with dilation of gastric/duodenal                            stricture(s) (eg, balloon,  bougie)                           91478, 80, Esophagogastroduodenoscopy, flexible,                            transoral; with biopsy, single or multiple Diagnosis Code(s):        --- Professional ---                           G95.62, Other complications of other bariatric                            procedure                           R10.9, Unspecified abdominal pain                           R11.0, Nausea CPT copyright 2019 [employer]. All rights reserved. The codes documented in this report are preliminary and upon coder review may  be revised to meet current compliance requirements. [name], MD [name],  09/04/2021 12:12:06 PM This report has been signed electronically. Number of Addenda: 0

## 2021-09-05 LAB — SURGICAL PATHOLOGY

## 2021-09-05 NOTE — Anesthesia Postprocedure Evaluation (Signed)
Anesthesia Post Note  Patient: Kelsey Donovan  Procedure(s) Performed: ESOPHAGOGASTRODUODENOSCOPY (EGD) WITH PROPOFOL BIOPSY ESOPHAGEAL DILATION  Patient location during evaluation: Phase II Anesthesia Type: General Level of consciousness: awake Pain management: pain level controlled Vital Signs Assessment: post-procedure vital signs reviewed and stable Respiratory status: spontaneous breathing and respiratory function stable Cardiovascular status: blood pressure returned to baseline and stable Postop Assessment: no headache and no apparent nausea or vomiting Anesthetic complications: no Comments: Late entry   No notable events documented.   Last Vitals:  Vitals:   09/04/21 1205 09/04/21 1211  BP: 91/62 113/65  Pulse: (!) 114 (!) 110  Resp: 14 18  Temp: 36.8 C   SpO2: 95% 96%    Last Pain:  Vitals:   09/04/21 1211  TempSrc:   PainSc: 0-No pain                 Louann Sjogren

## 2021-09-10 ENCOUNTER — Encounter (HOSPITAL_COMMUNITY): Payer: Self-pay | Admitting: Gastroenterology

## 2021-11-13 DIAGNOSIS — D699 Hemorrhagic condition, unspecified: Secondary | ICD-10-CM | POA: Insufficient documentation

## 2021-11-30 ENCOUNTER — Other Ambulatory Visit (INDEPENDENT_AMBULATORY_CARE_PROVIDER_SITE_OTHER): Payer: Self-pay | Admitting: Gastroenterology

## 2021-11-30 DIAGNOSIS — E538 Deficiency of other specified B group vitamins: Secondary | ICD-10-CM

## 2021-11-30 DIAGNOSIS — D539 Nutritional anemia, unspecified: Secondary | ICD-10-CM

## 2021-12-02 NOTE — Telephone Encounter (Signed)
Will refill once, needs to follow with PCP for further refills ?

## 2021-12-02 NOTE — Telephone Encounter (Signed)
I called and left a detailed message that we have filled this prescription,but for further refills she will need to reach pcp for future refills.  ?

## 2021-12-12 ENCOUNTER — Ambulatory Visit (INDEPENDENT_AMBULATORY_CARE_PROVIDER_SITE_OTHER): Payer: 59 | Admitting: Gastroenterology

## 2021-12-12 ENCOUNTER — Encounter (INDEPENDENT_AMBULATORY_CARE_PROVIDER_SITE_OTHER): Payer: Self-pay | Admitting: Gastroenterology

## 2021-12-12 VITALS — BP 110/81 | HR 93 | Temp 98.1°F | Ht 63.0 in | Wt 196.0 lb

## 2021-12-12 DIAGNOSIS — M6289 Other specified disorders of muscle: Secondary | ICD-10-CM | POA: Diagnosis not present

## 2021-12-12 DIAGNOSIS — R1013 Epigastric pain: Secondary | ICD-10-CM | POA: Diagnosis not present

## 2021-12-12 DIAGNOSIS — K719 Toxic liver disease, unspecified: Secondary | ICD-10-CM | POA: Insufficient documentation

## 2021-12-12 DIAGNOSIS — G8929 Other chronic pain: Secondary | ICD-10-CM

## 2021-12-12 DIAGNOSIS — K9189 Other postprocedural complications and disorders of digestive system: Secondary | ICD-10-CM | POA: Diagnosis not present

## 2021-12-12 NOTE — Patient Instructions (Signed)
Continue Zofran as needed for nausea ?Eat smaller portions during the day, 5-6 meals per day ?If worsening symptoms, please call us back to schedule a repeat EGD ?Will check LFTs in 6 months ?

## 2021-12-12 NOTE — Progress Notes (Signed)
Kelsey Donovan, M.D. ?Gastroenterology & Hepatology ?[facility] ?[address] ?[place], [place] [address] ? ?Primary Care Physician: ?Wilburt Finlay, MD ?[address] ?[place] [address] ? ?I will communicate my assessment and recommendations to the referring MD via EMR. ? ?Problems: ?Gastrojejunal anastomotic stricture ?Elevated liver enzymes, possibly related to the use of lamotrigine ?Epigastric pain ?Pelvic floor dysfunction ? ?History of Present Illness: ?Kelsey Donovan is a 31 y.o. female with past medical history of obesity status post Roux-en-Y gastric bypass, pituitary tumor, seizures, Kelsey Donovan? syndrome, who presents for evaluation of elevated liver enzymes, epigastric pain, abdominal fullness and diarrhea. ? ?The patient was last seen on 08/12/2021. At that time, the patient was counseled to discuss with her other physicians the possibility of starting lamotrigine as this could be some drug-induced liver injury.  She had celiac serology checked which was negative.  Was also scheduled for an EGD which she performed 09/04/2021 for evaluation of abdominal pain and nausea. ? ?She reports having nausea 3-4 times a week, which is intermittent. It usually happens an hour after eating and it improves with Zofran SL. No vomiting. She is still feeling the food is sitting for long period of time in her stomach. Overall feels better than in the past.  States that she has tried to eat smaller meals during the day so she does not feel too much fullness in her abdomen. She reports feeling better with dicyclomine every 12 hours for her abdominal pain, as her pain has much more improved. ? ?She is currently off lamotrigine - stopped next day after our last appointment. ? ?She was also advised to follow-up at [facility] for pelvic floor therapy due to history of pelvic floor dysfunction diagnosed in the previous anorectal manometry - has not started her therapy  sessions yet. She has 2-3 watery bowel movements every day, with some episodes of fecal soiling. ? ?I received today most recent labs from 12/03/2021 showing AST 56 ALT 40 alk phop 140 TB 0.4, normal electrolytes and kidney function, CBC WBC 4.8 Hb 12.9 PLT 223 ? ?The patient denies having any nausea, vomiting, fever, chills, hematochezia, melena, hematemesis, jaundice, pruritus or weight loss. ? ?Last EGD:09/04/2021  ? The examined esophagus was normal. ?Evidence of a gastric bypass was found. A gastric pouch with a 8 cm length from the GE junction to the gastrojejunal anastomosis was found. The staple line appeared intact. The gastrojejunal anastomosis was characterized by mild stenosis and stenosis 10 cm (inner diameter) x 0.5 cm (length). This was traversed. The pouch-to-jejunum limb was characterized by healthy appearing mucosa. The duodenum to jejunum limb was examined. Biopsies were taken with a cold forceps for Helicobacter pylori testing. A TTS dilator was passed through the scope. Dilation with a 12-13.5-15 mm pyloric balloon dilator was ?performed, max dilation 15 mm with mild mucosal disruption. ?The examined jejunum was normal. ?Last Colonoscopy: never ? ?Past Medical History: ?Past Medical History:  ?Diagnosis Date  ? Pituitary tumor   ? Renal disorder   ? kidney stones  ? Seizure ([place])   ? Thyroid disease   ? ? ?Past Surgical History: ?Past Surgical History:  ?Procedure Laterality Date  ? BIOPSY  09/04/2021  ? Procedure: BIOPSY;  Surgeon: Harvel Quale, MD;  Location: [facility];  Service: Gastroenterology;;  ? CHOLECYSTECTOMY    ? ESOPHAGEAL DILATION  09/04/2021  ? Procedure: ESOPHAGEAL DILATION;  Surgeon: Montez Morita, Quillian Quince, MD;  Location: [facility];  Service: Gastroenterology;;  ? ESOPHAGOGASTRODUODENOSCOPY (EGD) WITH PROPOFOL N/A 09/04/2021  ? Procedure: ESOPHAGOGASTRODUODENOSCOPY (EGD) WITH PROPOFOL;  Surgeon: Harvel Quale, MD;  Location: [facility];  Service:  Gastroenterology;  Laterality: N/A;  1:15  ? gastric bybpass    ? TONSILLECTOMY    ? ? ?Family History: ?Family History  ?Problem Relation Age of Onset  ? Hypertension Maternal Grandmother   ? Diabetes Maternal Grandmother   ? Diabetes Maternal Grandfather   ? Hypertension Maternal Grandfather   ? Cancer Mother   ?     cervical  ? Hypertension Brother   ? Asthma Daughter   ? ADD / ADHD Daughter   ? ? ?Social History: ?Social History  ? ?Tobacco Use  ?Smoking Status Every Day  ? Packs/day: 0.25  ? Types: Cigarettes  ? Passive exposure: Current  ?Smokeless Tobacco Never  ? ?Social History  ? ?Substance and Sexual Activity  ?Alcohol Use Not Currently  ? ?Social History  ? ?Substance and Sexual Activity  ?Drug Use No  ? ? ?Allergies: ?Allergies  ?Allergen Reactions  ? Ibuprofen Other (See Comments)  ?  Pt told not to take Ibuprofen based pain medications due to having gastric bypass and "increased risk of thinning my blood".  ?Pt told not to take Ibuprofen based pain medications due to having gastric bypass and "increased risk of thinning my blood".   ? ? ?Medications: ?Current Outpatient Medications  ?Medication Sig Dispense Refill  ? amphetamine-dextroamphetamine (ADDERALL) 30 MG tablet Take 30 mg by mouth 2 (two) times daily.    ? ARIPiprazole (ABILIFY) 5 MG tablet Take 5 mg by mouth in the morning.    ? B Complex-C (B-COMPLEX WITH VITAMIN C) tablet Take 1 tablet by mouth every Monday.    ? budesonide-formoterol (SYMBICORT) 160-4.5 MCG/ACT inhaler Inhale 2 puffs into the lungs 2 (two) times daily as needed (respiratory issues).    ? cycloSPORINE (RESTASIS) 0.05 % ophthalmic emulsion 1 drop 2 (two) times daily as needed (dry/irritated eyes.).    ? dicyclomine (BENTYL) 10 MG capsule Take 1 capsule (10 mg total) by mouth every 12 (twelve) hours as needed (abdominal pain). 60 capsule 5  ? Ergocalciferol (VITAMIN D2) 50 MCG (2000 UT) TABS Take 2,000 Units by mouth every Monday.    ? famotidine (PEPCID) 20 MG tablet Take  20 mg by mouth at bedtime.    ? fesoterodine (TOVIAZ) 8 MG TB24 tablet Take 8 mg by mouth in the morning.    ? folic acid (FOLVITE) 1 MG tablet TAKE 1 TABLET(1 MG) BY MOUTH DAILY 90 tablet 0  ? levothyroxine (SYNTHROID) 175 MCG tablet Take 175 mcg by mouth daily before breakfast.    ? ondansetron (ZOFRAN-ODT) 4 MG disintegrating tablet Take 4 mg by mouth daily as needed for nausea or vomiting.    ? oxyCODONE (OXY IR/ROXICODONE) 5 MG immediate release tablet Take 5 mg by mouth in the morning and at bedtime.    ? pantoprazole (PROTONIX) 40 MG tablet Take 40 mg by mouth every morning.    ? Prenatal Vit-Fe Fumarate-FA (MULTIVITAMIN-PRENATAL) 27-0.8 MG TABS tablet Take 1 tablet by mouth in the morning.    ? QUEtiapine (SEROQUEL) 300 MG tablet Take 300 mg by mouth at bedtime.    ? vitamin B-12 (CYANOCOBALAMIN) 1000 MCG tablet Take 1 tablet (1,000 mcg total) by mouth daily. 90 tablet 0  ? butalbital-acetaminophen-caffeine (FIORICET) 50-325-40 MG tablet Take 1 tablet by mouth every 4 (four) hours as needed for headache. (Patient not taking:  Reported on 12/12/2021)    ? cyanocobalamin (,VITAMIN B-12,) 1000 MCG/ML injection Inject 1,000 mcg into the muscle every 30 (thirty) days. (Patient not taking: Reported on 12/12/2021)    ? cyclobenzaprine (FLEXERIL) 5 MG tablet Take 5 mg by mouth every 12 (twelve) hours as needed for muscle spasms. (Patient not taking: Reported on 12/12/2021)    ? neomycin-polymyxin b-dexamethasone (MAXITROL) 3.5-10000-0.1 OINT Place 1 application into both eyes at bedtime as needed (eye irritation.). (Patient not taking: Reported on 12/12/2021)    ? ?No current facility-administered medications for this visit.  ? ? ?Review of Systems: ?GENERAL: negative for malaise, night sweats ?HEENT: No changes in hearing or vision, no nose bleeds or other nasal problems. ?NECK: Negative for lumps, goiter, pain and significant neck swelling ?RESPIRATORY: Negative for cough, wheezing ?CARDIOVASCULAR: Negative for chest  pain, leg swelling, palpitations, orthopnea ?GI: SEE HPI ?MUSCULOSKELETAL: Negative for joint pain or swelling, back pain, and muscle pain. ?SKIN: Negative for lesions, rash ?PSYCH: Negative for sleep disturbance

## 2022-01-06 DIAGNOSIS — R21 Rash and other nonspecific skin eruption: Secondary | ICD-10-CM | POA: Insufficient documentation

## 2022-01-06 DIAGNOSIS — E039 Hypothyroidism, unspecified: Secondary | ICD-10-CM | POA: Insufficient documentation

## 2022-01-06 DIAGNOSIS — E669 Obesity, unspecified: Secondary | ICD-10-CM | POA: Insufficient documentation

## 2022-01-06 DIAGNOSIS — N3281 Overactive bladder: Secondary | ICD-10-CM | POA: Insufficient documentation

## 2022-01-06 DIAGNOSIS — J329 Chronic sinusitis, unspecified: Secondary | ICD-10-CM | POA: Insufficient documentation

## 2022-06-10 ENCOUNTER — Encounter (INDEPENDENT_AMBULATORY_CARE_PROVIDER_SITE_OTHER): Payer: Self-pay | Admitting: Gastroenterology

## 2022-06-19 ENCOUNTER — Ambulatory Visit (INDEPENDENT_AMBULATORY_CARE_PROVIDER_SITE_OTHER): Payer: 59 | Admitting: Gastroenterology

## 2022-08-07 ENCOUNTER — Ambulatory Visit (INDEPENDENT_AMBULATORY_CARE_PROVIDER_SITE_OTHER): Payer: Medicare Other | Admitting: Gastroenterology

## 2022-08-07 ENCOUNTER — Encounter (INDEPENDENT_AMBULATORY_CARE_PROVIDER_SITE_OTHER): Payer: Self-pay | Admitting: Gastroenterology

## 2022-08-12 DIAGNOSIS — Z79899 Other long term (current) drug therapy: Secondary | ICD-10-CM | POA: Insufficient documentation

## 2022-08-12 DIAGNOSIS — Z789 Other specified health status: Secondary | ICD-10-CM | POA: Insufficient documentation

## 2022-08-12 DIAGNOSIS — M2550 Pain in unspecified joint: Secondary | ICD-10-CM | POA: Insufficient documentation

## 2022-08-12 DIAGNOSIS — G894 Chronic pain syndrome: Secondary | ICD-10-CM | POA: Insufficient documentation

## 2022-08-12 DIAGNOSIS — M899 Disorder of bone, unspecified: Secondary | ICD-10-CM | POA: Insufficient documentation

## 2022-08-12 NOTE — Progress Notes (Addendum)
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 time   Interventional management options: Ms. Lagunes was informed that there is no guarantee that she would be a candidate for interventional therapies. The decision will be based on the results of diagnostic studies, as well as Ms. Difatta's risk profile.  Procedure(s) under consideration:  Pending results of ordered studies      Interventional Therapies  Risk Factors  Complex Considerations:  N/A   Planned  Pending:   See above for possible orders   Under consideration:   Pending completion of evaluation  Completed:   None at this time   Completed by other providers:   None at this time   Therapeutic  Palliative (PRN) options:   None established      Provider-requested follow-up: Return for (39mn), Eval-day (M,W), (F2F), 2nd Visit, for review of ordered tests.  Future Appointments  Date Time Provider DAlto 09/22/2022 11:00 AM NMilinda Pointer Donovan [facility] None     Note by: FGaspar Cola Donovan Date: 08/13/2022; Time: 9:56 AM

## 2022-08-12 NOTE — Patient Instructions (Signed)
____________________________________________________________________________________________  New Patients  Welcome to [facility].   Initial Visit The first or initial visit consists of an evaluation only.   Interventional pain management.  We offer therapies other than opioid controlled substances to manage chronic pain. These include, but are not limited to, diagnostic, therapeutic, and palliative specialized injection therapies (i.e.: Epidural Steroids, Facet Blocks, etc.). We specialize in a variety of nerve blocks as well as radiofrequency treatments. We offer pain implant evaluations and trials, as well as follow up management. In addition we also provide a variety joint injections, including Viscosupplementation (AKA: Gel Therapy).  Prescription Pain Medication We provide evaluations for/of pharmacologic therapies. Recommendations will follow CDC Guidelines.  We no longer take patients for long-term medication management. We will not be taking over your pain medications.  ____________________________________________________________________________________________    ____________________________________________________________________________________________  Patient Information update  To: All of our patients.  Re: Name change.  It has been made official that our current name, "[facility]"   will soon be changed to "[facility]".   The purpose of this change is to eliminate any confusion created by the concept of our practice being a "Medication Management Pain Clinic". In the past this has led to the misconception that we treat pain primarily by the use of prescription medications.  Nothing can be farther from the truth.   Understanding PAIN MANAGEMENT: To further understand what our practice does, you  first have to understand that "Pain Management" is a subspecialty that requires additional training once a physician has completed their specialty training, which can be in either Anesthesia, Neurology, Psychiatry, or Physical Medicine and Rehabilitation (PMR). Each one of these contributes to the final approach taken by each physician to the management of their patient's pain. To be a "Pain Management Specialist" you must have first completed one of the specialty trainings below.  Anesthesiologists - trained in clinical pharmacology and interventional techniques such as nerve blockade and regional as well as central neuroanatomy. They are trained to block pain before, during, and after surgical interventions.  Neurologists - trained in the diagnosis and pharmacological treatment of complex neurological conditions, such as Multiple Sclerosis, Parkinson's, spinal cord injuries, and other systemic conditions that may be associated with symptoms that may include but are not limited to pain. They tend to rely primarily on the treatment of chronic pain using prescription medications.  Psychiatrist - trained in conditions affecting the psychosocial wellbeing of patients including but not limited to depression, anxiety, schizophrenia, personality disorders, addiction, and other substance use disorders that may be associated with chronic pain. They tend to rely primarily on the treatment of chronic pain using prescription medications.   Physical Medicine and Rehabilitation (PMR) physicians, also known as physiatrists - trained to treat a wide variety of medical conditions affecting the brain, spinal cord, nerves, bones, joints, ligaments, muscles, and tendons. Their training is primarily aimed at treating patients that have suffered injuries that have caused severe physical impairment. Their training is primarily aimed at the physical therapy and rehabilitation of those patients. They may also work alongside  orthopedic surgeons or neurosurgeons using their expertise in assisting surgical patients to recover after their surgeries.  INTERVENTIONAL PAIN MANAGEMENT is sub-subspecialty of Pain Management.  Our physicians are Board-certified in Anesthesia, Pain Management, and Interventional Pain Management.  This meaning that not only have they been trained and Board-certified in their specialty of Anesthesia, and   subspecialty of Pain Management, but they have also received further training in the sub-subspecialty of Interventional Pain Management, in order to become Board-certified as INTERVENTIONAL PAIN MANAGEMENT SPECIALIST.    Mission: Our goal is to use our skills in  INTERVENTIONAL PAIN MANAGEMENT as alternatives to the chronic use of prescription opioid medications for the treatment of pain. To make this more clear, we have changed our name to reflect what we do and offer. We will continue to offer medication management assessment and recommendations, but we will not be taking over any patient's medication management.  ____________________________________________________________________________________________     

## 2022-08-13 ENCOUNTER — Encounter: Payer: Self-pay | Admitting: Pain Medicine

## 2022-08-13 ENCOUNTER — Ambulatory Visit: Payer: 59 | Attending: Pain Medicine | Admitting: Pain Medicine

## 2022-08-13 VITALS — BP 109/91 | HR 127 | Temp 97.2°F | Ht 63.0 in | Wt 180.0 lb

## 2022-08-13 DIAGNOSIS — M899 Disorder of bone, unspecified: Secondary | ICD-10-CM | POA: Insufficient documentation

## 2022-08-13 DIAGNOSIS — R937 Abnormal findings on diagnostic imaging of other parts of musculoskeletal system: Secondary | ICD-10-CM | POA: Insufficient documentation

## 2022-08-13 DIAGNOSIS — M545 Low back pain, unspecified: Secondary | ICD-10-CM | POA: Insufficient documentation

## 2022-08-13 DIAGNOSIS — G894 Chronic pain syndrome: Secondary | ICD-10-CM | POA: Insufficient documentation

## 2022-08-13 DIAGNOSIS — M25551 Pain in right hip: Secondary | ICD-10-CM | POA: Diagnosis present

## 2022-08-13 DIAGNOSIS — G8929 Other chronic pain: Secondary | ICD-10-CM | POA: Diagnosis present

## 2022-08-13 DIAGNOSIS — M25552 Pain in left hip: Secondary | ICD-10-CM | POA: Insufficient documentation

## 2022-08-13 DIAGNOSIS — Z79899 Other long term (current) drug therapy: Secondary | ICD-10-CM | POA: Insufficient documentation

## 2022-08-13 DIAGNOSIS — Z789 Other specified health status: Secondary | ICD-10-CM | POA: Diagnosis present

## 2022-08-13 DIAGNOSIS — G610 Guillain-Barre syndrome: Secondary | ICD-10-CM | POA: Insufficient documentation

## 2022-08-13 NOTE — Progress Notes (Signed)
Safety precautions to be maintained throughout the outpatient stay will include: orient to surroundings, keep bed in low position, maintain call bell within reach at all times, provide assistance with transfer out of bed and ambulation.  

## 2022-08-14 ENCOUNTER — Encounter: Payer: Self-pay | Admitting: Pain Medicine

## 2022-08-14 DIAGNOSIS — R700 Elevated erythrocyte sedimentation rate: Secondary | ICD-10-CM | POA: Insufficient documentation

## 2022-08-14 DIAGNOSIS — E538 Deficiency of other specified B group vitamins: Secondary | ICD-10-CM | POA: Insufficient documentation

## 2022-08-16 LAB — COMPLIANCE DRUG ANALYSIS, UR

## 2022-08-18 ENCOUNTER — Encounter: Payer: Self-pay | Admitting: Pain Medicine

## 2022-08-18 DIAGNOSIS — R892 Abnormal level of other drugs, medicaments and biological substances in specimens from other organs, systems and tissues: Secondary | ICD-10-CM | POA: Insufficient documentation

## 2022-08-18 DIAGNOSIS — F1290 Cannabis use, unspecified, uncomplicated: Secondary | ICD-10-CM | POA: Insufficient documentation

## 2022-08-20 LAB — COMP. METABOLIC PANEL (12)
AST: 66 IU/L — ABNORMAL HIGH (ref 0–40)
Albumin/Globulin Ratio: 1 — ABNORMAL LOW (ref 1.2–2.2)
Albumin: 3.5 g/dL — ABNORMAL LOW (ref 3.9–4.9)
Alkaline Phosphatase: 224 IU/L — ABNORMAL HIGH (ref 44–121)
BUN/Creatinine Ratio: 5 — ABNORMAL LOW (ref 9–23)
BUN: 3 mg/dL — ABNORMAL LOW (ref 6–20)
Bilirubin Total: 0.3 mg/dL (ref 0.0–1.2)
Calcium: 9.4 mg/dL (ref 8.7–10.2)
Chloride: 105 mmol/L (ref 96–106)
Creatinine, Ser: 0.56 mg/dL — ABNORMAL LOW (ref 0.57–1.00)
Globulin, Total: 3.5 g/dL (ref 1.5–4.5)
Glucose: 89 mg/dL (ref 70–99)
Potassium: 4.1 mmol/L (ref 3.5–5.2)
Sodium: 142 mmol/L (ref 134–144)
Total Protein: 7 g/dL (ref 6.0–8.5)
eGFR: 124 mL/min/{1.73_m2} (ref >59)

## 2022-08-20 LAB — 25-HYDROXY VITAMIN D LCMS D2+D3
25-Hydroxy, Vitamin D-2: 26 ng/mL
25-Hydroxy, Vitamin D-3: 3.9 ng/mL
25-Hydroxy, Vitamin D: 30 ng/mL

## 2022-08-20 LAB — VITAMIN B12: Vitamin B-12: 345 pg/mL (ref 232–1245)

## 2022-08-20 LAB — MAGNESIUM: Magnesium: 1.8 mg/dL (ref 1.6–2.3)

## 2022-08-20 LAB — SEDIMENTATION RATE: Sed Rate: 65 mm/hr — ABNORMAL HIGH (ref 0–32)

## 2022-08-20 LAB — C-REACTIVE PROTEIN: CRP: 1 mg/L (ref 0–10)

## 2022-09-18 DIAGNOSIS — M47816 Spondylosis without myelopathy or radiculopathy, lumbar region: Secondary | ICD-10-CM | POA: Insufficient documentation

## 2022-09-18 DIAGNOSIS — M5136 Other intervertebral disc degeneration, lumbar region: Secondary | ICD-10-CM | POA: Insufficient documentation
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
## 2022-09-22 ENCOUNTER — Ambulatory Visit (HOSPITAL_BASED_OUTPATIENT_CLINIC_OR_DEPARTMENT_OTHER): Payer: Medicaid Other | Admitting: Pain Medicine

## 2022-09-22 DIAGNOSIS — R892 Abnormal level of other drugs, medicaments and biological substances in specimens from other organs, systems and tissues: Secondary | ICD-10-CM

## 2022-09-22 DIAGNOSIS — G8929 Other chronic pain: Secondary | ICD-10-CM

## 2022-09-22 DIAGNOSIS — M545 Low back pain, unspecified: Secondary | ICD-10-CM

## 2022-09-22 DIAGNOSIS — R937 Abnormal findings on diagnostic imaging of other parts of musculoskeletal system: Secondary | ICD-10-CM

## 2022-09-22 DIAGNOSIS — Z91199 Patient's noncompliance with other medical treatment and regimen due to unspecified reason: Secondary | ICD-10-CM | POA: Insufficient documentation

## 2022-09-22 DIAGNOSIS — M5136 Other intervertebral disc degeneration, lumbar region: Secondary | ICD-10-CM

## 2022-09-22 DIAGNOSIS — M47816 Spondylosis without myelopathy or radiculopathy, lumbar region: Secondary | ICD-10-CM

## 2022-09-22 DIAGNOSIS — F1290 Cannabis use, unspecified, uncomplicated: Secondary | ICD-10-CM

## 2023-08-18 ENCOUNTER — Other Ambulatory Visit (INDEPENDENT_AMBULATORY_CARE_PROVIDER_SITE_OTHER): Payer: Self-pay | Admitting: Gastroenterology

## 2023-08-18 DIAGNOSIS — D539 Nutritional anemia, unspecified: Secondary | ICD-10-CM

## 2023-08-18 DIAGNOSIS — E538 Deficiency of other specified B group vitamins: Secondary | ICD-10-CM

## 2024-06-09 NOTE — Progress Notes (Deleted)
 [facility]   Telephone:(336) [phone] Fax:(336) [phone]   Clinic New Consult Note   Patient Care Team: Kotturi, Vinay K, MD as PCP - General (Family Medicine) 06/09/2024  CHIEF COMPLAINTS/PURPOSE OF CONSULTATION:  IDA  REFERRING PHYSICIAN: Margarete Maeola DASEN, FNP   Discussed the use of AI scribe software for clinical note transcription with the patient, who gave verbal consent to proceed.  History of Present Illness      MEDICAL HISTORY:  Past Medical History:  Diagnosis Date   Pituitary tumor    Renal disorder    kidney stones   Seizure (HCC)    Thyroid disease     SURGICAL HISTORY: Past Surgical History:  Procedure Laterality Date   BIOPSY  09/04/2021   Procedure: BIOPSY;  Surgeon: Eartha Angelia Sieving, MD;  Location: [facility];  Service: Gastroenterology;;   CHOLECYSTECTOMY     ESOPHAGEAL DILATION  09/04/2021   Procedure: ESOPHAGEAL DILATION;  Surgeon: Eartha Angelia Sieving, MD;  Location: [facility];  Service: Gastroenterology;;   ESOPHAGOGASTRODUODENOSCOPY (EGD) WITH PROPOFOL  N/A 09/04/2021   Procedure: ESOPHAGOGASTRODUODENOSCOPY (EGD) WITH PROPOFOL ;  Surgeon: Eartha Angelia Sieving, MD;  Location: [facility];  Service: Gastroenterology;  Laterality: N/A;  1:15   gastric bybpass     TONSILLECTOMY      SOCIAL HISTORY: Social History   Socioeconomic History   Marital status: Single    Spouse name: Not on file   Number of children: Not on file   Years of education: Not on file   Highest education level: Not on file  Occupational History   Not on file  Tobacco Use   Smoking status: Every Day    Current packs/day: 0.25    Types: Cigarettes    Passive exposure: Current   Smokeless tobacco: Never  Vaping Use   Vaping status: Every Day  Substance and Sexual Activity   Alcohol use: Not Currently   Drug use: No   Sexual activity: Yes    Birth control/protection: Injection  Other Topics Concern   Not on file  Social  History Narrative   Not on file   Social Drivers of Health   Financial Resource Strain: Low Risk  (04/14/2023)   Received from [facility]   Overall Financial Resource Strain (CARDIA)    Difficulty of Paying Living Expenses: Not hard at all  Food Insecurity: No Food Insecurity (04/14/2023)   Received from [facility]   Hunger Vital Sign    Within the past 12 months, you worried that your food would run out before you got the money to buy more.: Never true    Within the past 12 months, the food you bought just didn't last and you didn't have money to get more.: Never true  Transportation Needs: No Transportation Needs (04/14/2023)   Received from [facility]   PRAPARE - Transportation    Lack of Transportation (Medical): No    Lack of Transportation (Non-Medical): No  Physical Activity: Inactive (11/13/2021)   Received from [facility]   Exercise Vital Sign    On average, how many days per week do you engage in moderate to strenuous exercise (like a brisk walk)?: 0 days    On average, how many minutes do you engage in exercise at this level?: 0 min  Stress: No Stress Concern Present (11/13/2021)   Received from [facility] - Occupational Stress Questionnaire    Feeling of Stress :  Not at all  Social Connections: Moderately Isolated (11/13/2021)   Received from [facility]   Social Connection and Isolation Panel    In a typical week, how many times do you talk on the phone with family, friends, or neighbors?: More than three times a week    How often do you get together with friends or relatives?: Once a week    How often do you attend church or religious services?: Never    Do you belong to any clubs or organizations such as church groups, unions, fraternal or athletic groups, or school groups?: No    How often do you attend meetings of the clubs or organizations you belong to?: Never    Are you married, widowed,  divorced, separated, never married, or living with a partner?: Living with partner  Intimate Partner Violence: Not At Risk (11/13/2021)   Received from [facility]   Humiliation, Afraid, Rape, and Kick questionnaire    Within the last year, have you been afraid of your partner or ex-partner?: No    Within the last year, have you been humiliated or emotionally abused in other ways by your partner or ex-partner?: No    Within the last year, have you been kicked, hit, slapped, or otherwise physically hurt by your partner or ex-partner?: No    Within the last year, have you been raped or forced to have any kind of sexual activity by your partner or ex-partner?: No    FAMILY HISTORY: Family History  Problem Relation Age of Onset   Hypertension Maternal Grandmother    Diabetes Maternal Grandmother    Diabetes Maternal Grandfather    Hypertension Maternal Grandfather    Cancer Mother        cervical   Hypertension Brother    Asthma Daughter    ADD / ADHD Daughter     ALLERGIES:  is allergic to ibuprofen .  MEDICATIONS:  Current Outpatient Medications  Medication Sig Dispense Refill   amphetamine-dextroamphetamine (ADDERALL) 30 MG tablet Take 30 mg by mouth 2 (two) times daily.     ARIPiprazole (ABILIFY) 5 MG tablet Take 5 mg by mouth in the morning.     B Complex-C (B-COMPLEX WITH VITAMIN C) tablet Take 1 tablet by mouth every Monday.     cetirizine (ZYRTEC) 10 MG tablet Take 10 mg by mouth daily.     COSENTYX SENSOREADY, 300 MG, 150 MG/ML SOAJ Inject 150 mg into the skin every 30 (thirty) days.     cycloSPORINE (RESTASIS) 0.05 % ophthalmic emulsion 1 drop 2 (two) times daily as needed (dry/irritated eyes.).     dicyclomine  (BENTYL ) 10 MG capsule Take 1 capsule (10 mg total) by mouth every 12 (twelve) hours as needed (abdominal pain). 60 capsule 5   Ergocalciferol  (VITAMIN D2) 50 MCG (2000 UT) TABS Take 2,000 Units by mouth every Monday.     famotidine (PEPCID) 20 MG tablet Take 20 mg  by mouth at bedtime.     fesoterodine (TOVIAZ) 8 MG TB24 tablet Take 8 mg by mouth in the morning.     fluticasone (FLONASE) 50 MCG/ACT nasal spray Place 2 sprays into both nostrils daily.     folic acid  (FOLVITE ) 1 MG tablet TAKE 1 TABLET(1 MG) BY MOUTH DAILY 90 tablet 0   GNP VITAMIN B-1 100 MG tablet Take 100 mg by mouth daily.     HYDROcodone -acetaminophen  (NORCO/VICODIN) 5-325 MG tablet Take 2 tablets by mouth 2 (two) times daily.     hydrOXYzine (  VISTARIL) 25 MG capsule Take 50 mg by mouth 2 (two) times daily.     levothyroxine (SYNTHROID) 175 MCG tablet Take 175 mcg by mouth daily before breakfast.     medroxyPROGESTERone  Acetate 150 MG/ML SUSY Inject 1 mL into the muscle every 30 (thirty) days.     ondansetron  (ZOFRAN -ODT) 4 MG disintegrating tablet Take 4 mg by mouth daily as needed for nausea or vomiting.     pantoprazole (PROTONIX) 40 MG tablet Take 40 mg by mouth every morning.     Prenatal Vit-Fe Fumarate-FA (MULTIVITAMIN-PRENATAL) 27-0.8 MG TABS tablet Take 1 tablet by mouth in the morning.     QUEtiapine (SEROQUEL) 300 MG tablet Take 300 mg by mouth at bedtime.     thiamine (VITAMIN B1) 100 MG tablet Take 1 tablet by mouth daily.     vitamin B-12 (CYANOCOBALAMIN) 1000 MCG tablet Take 1 tablet (1,000 mcg total) by mouth daily. 90 tablet 0   No current facility-administered medications for this visit.    REVIEW OF SYSTEMS:   Constitutional: Denies fevers, chills or abnormal night sweats Eyes: Denies blurriness of vision, double vision or watery eyes Ears, nose, mouth, throat, and face: Denies mucositis or sore throat Respiratory: Denies cough, dyspnea or wheezes Cardiovascular: Denies palpitation, chest discomfort or lower extremity swelling Gastrointestinal:  Denies nausea, heartburn or change in bowel habits Skin: Denies abnormal skin rashes Lymphatics: Denies new lymphadenopathy or easy bruising Neurological:Denies numbness, tingling or new weaknesses Behavioral/Psych:  Mood is stable, no new changes  All other systems were reviewed with the patient and are negative.  PHYSICAL EXAMINATION: ECOG PERFORMANCE STATUS: {CHL ONC ECOG [phone]}  There were no vitals filed for this visit. There were no vitals filed for this visit.  GENERAL:alert, no distress and comfortable SKIN: skin color, texture, turgor are normal, no rashes or significant lesions EYES: normal, conjunctiva are pink and non-injected, sclera clear OROPHARYNX:no exudate, no erythema and lips, buccal mucosa, and tongue normal  NECK: supple, thyroid normal size, non-tender, without nodularity LYMPH:  no palpable lymphadenopathy in the cervical, axillary or inguinal LUNGS: clear to auscultation and percussion with normal breathing effort HEART: regular rate & rhythm and no murmurs and no lower extremity edema ABDOMEN:abdomen soft, non-tender and normal bowel sounds Musculoskeletal:no cyanosis of digits and no clubbing  PSYCH: alert & oriented x 3 with fluent speech NEURO: no focal motor/sensory deficits  Physical Exam   LABORATORY DATA:  I have reviewed the data as listed    Latest Ref Rng & Units 09/04/2021   12:39 PM 01/21/2019    8:59 PM 01/14/2019   11:13 AM  CBC  WBC 4.0 - 10.5 K/uL 3.1  7.4  5.7   Hemoglobin 12.0 - 15.0 g/dL 88.1  89.8  89.1   Hematocrit 36.0 - 46.0 % 34.2  33.2  37.4   Platelets 150 - 400 K/uL 174  370  319     @cmpl @  RADIOGRAPHIC STUDIES: I have personally reviewed the radiological images as listed and agreed with the findings in the report. No results found.  ASSESSMENT & PLAN:   Assessment and Plan Assessment & Plan      No orders of the defined types were placed in this encounter.   All questions were answered. The patient knows to call the clinic with any problems, questions or concerns. I spent {CHL ONC TIME VISIT - DTPQU:[id number]} counseling the patient face to face. The total time spent in the appointment was {CHL ONC TIME VISIT -  DTPQU:[id number]} including  review of chart and various tests results, discussions about plan of care and coordination of care plan.     Onita Mattock, MD 06/09/2024 10:08 PM

## 2024-06-10 ENCOUNTER — Inpatient Hospital Stay: Admitting: Hematology

## 2024-06-10 ENCOUNTER — Other Ambulatory Visit

## 2024-06-15 ENCOUNTER — Encounter (INDEPENDENT_AMBULATORY_CARE_PROVIDER_SITE_OTHER): Payer: Self-pay | Admitting: Gastroenterology

## 2024-07-15 ENCOUNTER — Encounter: Payer: Self-pay | Admitting: Hematology

## 2024-07-15 ENCOUNTER — Inpatient Hospital Stay

## 2024-07-15 ENCOUNTER — Inpatient Hospital Stay: Attending: Hematology | Admitting: Hematology

## 2024-07-15 DIAGNOSIS — E538 Deficiency of other specified B group vitamins: Secondary | ICD-10-CM | POA: Insufficient documentation

## 2024-07-15 DIAGNOSIS — Z9884 Bariatric surgery status: Secondary | ICD-10-CM | POA: Diagnosis not present

## 2024-07-15 DIAGNOSIS — F17210 Nicotine dependence, cigarettes, uncomplicated: Secondary | ICD-10-CM | POA: Insufficient documentation

## 2024-07-15 DIAGNOSIS — E611 Iron deficiency: Secondary | ICD-10-CM | POA: Diagnosis present

## 2024-07-15 DIAGNOSIS — D500 Iron deficiency anemia secondary to blood loss (chronic): Secondary | ICD-10-CM

## 2024-07-15 LAB — CBC WITH DIFFERENTIAL/PLATELET
Abs Immature Granulocytes: 0 K/uL (ref 0.00–0.07)
Basophils Absolute: 0 K/uL (ref 0.0–0.1)
Basophils Relative: 1 %
Eosinophils Absolute: 0.4 K/uL (ref 0.0–0.5)
Eosinophils Relative: 9 %
HCT: 41.2 % (ref 36.0–46.0)
Hemoglobin: 12.4 g/dL (ref 12.0–15.0)
Immature Granulocytes: 0 %
Lymphocytes Relative: 44 %
Lymphs Abs: 2 K/uL (ref 0.7–4.0)
MCH: 23.7 pg — ABNORMAL LOW (ref 26.0–34.0)
MCHC: 30.1 g/dL (ref 30.0–36.0)
MCV: 78.8 fL — ABNORMAL LOW (ref 80.0–100.0)
Monocytes Absolute: 0.4 K/uL (ref 0.1–1.0)
Monocytes Relative: 9 %
Neutro Abs: 1.7 K/uL (ref 1.7–7.7)
Neutrophils Relative %: 37 %
Platelets: 298 K/uL (ref 150–400)
RBC: 5.23 MIL/uL — ABNORMAL HIGH (ref 3.87–5.11)
RDW: 15.6 % — ABNORMAL HIGH (ref 11.5–15.5)
WBC: 4.5 K/uL (ref 4.0–10.5)
nRBC: 0 % (ref 0.0–0.2)

## 2024-07-15 LAB — VITAMIN B12: Vitamin B-12: 398 pg/mL (ref 180–914)

## 2024-07-15 LAB — FERRITIN: Ferritin: 11 ng/mL (ref 11–307)

## 2024-07-15 NOTE — Progress Notes (Addendum)
 [facility]   Telephone:(336) [phone] Fax:(336) [phone]   Clinic New Consult Note   Patient Care Team: Kotturi, Vinay K, MD as PCP - General (Family Medicine) 07/15/2024  CHIEF COMPLAINTS/PURPOSE OF CONSULTATION:  Iron deficiency  REFERRING PHYSICIAN: Margarete Maeola DASEN, FNP   Discussed the use of AI scribe software for clinical note transcription with the patient, who gave verbal consent to proceed.  History of Present Illness Kelsey Donovan is a 33 year old female who presents with iron deficiency.  She has been on oral iron supplementation for six months without improvement in symptoms. Fatigue persists, but she manages daily activities without difficulty. She underwent gastric bypass surgery in 2015 and has been on vitamin B, D, and iron supplements since then. Menstrual periods are irregular, with variability in flow. An upper endoscopy in 2023 was normal, and there is no history of blood in her stool. She has not had a colonoscopy.  She has a history of significant alcohol use from 2018 to 2020, leading to multiple hospitalizations and memory issues, but has since quit alcohol. She has a history of neuropathy and frequent hospital admissions in 2020 for abdominal pain. She smokes half a pack of cigarettes daily since age 43. Family history includes cervical cancer in her mother.     MEDICAL HISTORY:  Past Medical History:  Diagnosis Date   Alcoholic liver disease    Pituitary tumor    Renal disorder    kidney stones   Seizure (HCC)    Thyroid disease     SURGICAL HISTORY: Past Surgical History:  Procedure Laterality Date   BIOPSY  09/04/2021   Procedure: BIOPSY;  Surgeon: Eartha Angelia Sieving, MD;  [facility];  Service: Gastroenterology;;   CHOLECYSTECTOMY     ESOPHAGEAL DILATION  09/04/2021   Procedure: ESOPHAGEAL DILATION;  Surgeon: Eartha Angelia Sieving, MD;  [facility]: [facility];  Service: Gastroenterology;;    ESOPHAGOGASTRODUODENOSCOPY (EGD) WITH PROPOFOL  N/A 09/04/2021   Procedure: ESOPHAGOGASTRODUODENOSCOPY (EGD) WITH PROPOFOL ;  Surgeon: Eartha Angelia Sieving, MD;  Location: [facility];  Service: Gastroenterology;  Laterality: N/A;  1:15   gastric bybpass  2015   TONSILLECTOMY      SOCIAL HISTORY: Social History   Socioeconomic History   Marital status: Single    Spouse name: Not on file   Number of children: 2   Years of education: Not on file   Highest education level: Not on file  Occupational History   Not on file  Tobacco Use   Smoking status: Every Day    Current packs/day: 0.50    Average packs/day: 0.5 packs/day for 19.9 years (9.9 ttl pk-yrs)    Types: Cigarettes    Start date: 2006    Passive exposure: Current   Smokeless tobacco: Never  Vaping Use   Vaping status: Every Day  Substance and Sexual Activity   Alcohol use: Not Currently    Comment: heavy drinker around 2018-2020   Drug use: No   Sexual activity: Yes    Birth control/protection: Injection  Other Topics Concern   Not on file  Social History Narrative   Not on file   Social Drivers of Health   Financial Resource Strain: Low Risk (04/14/2023)   Received from [facility]   Overall Financial Resource Strain (CARDIA)    Difficulty of Paying Living Expenses: Not hard at all  Food Insecurity: No Food Insecurity (04/14/2023)   Received from [facility]   Hunger Vital Sign  Within the past 12 months, you worried that your food would run out before you got the money to buy more.: Never true    Within the past 12 months, the food you bought just didn't last and you didn't have money to get more.: Never true  Transportation Needs: No Transportation Needs (04/14/2023)   Received from [facility]   PRAPARE - Transportation    Lack of Transportation (Medical): No    Lack of Transportation (Non-Medical): No  Physical Activity: Inactive (11/13/2021)   Received from [facility]   Exercise  Vital Sign    On average, how many days per week do you engage in moderate to strenuous exercise (like a brisk walk)?: 0 days    On average, how many minutes do you engage in exercise at this level?: 0 min  Stress: No Stress Concern Present (11/13/2021)   Received from [facility] - Occupational Stress Questionnaire    Feeling of Stress : Not at all  Social Connections: Moderately Isolated (11/13/2021)   Received from [facility]   Social Connection and Isolation Panel    In a typical week, how many times do you talk on the phone with family, friends, or neighbors?: More than three times a week    How often do you get together with friends or relatives?: Once a week    How often do you attend church or religious services?: Never    Do you belong to any clubs or organizations such as church groups, unions, fraternal or athletic groups, or school groups?: No    How often do you attend meetings of the clubs or organizations you belong to?: Never    Are you married, widowed, divorced, separated, never married, or living with a partner?: Living with partner  Intimate Partner Violence: Not At Risk (11/13/2021)   Received from [facility]   Humiliation, Afraid, Rape, and Kick questionnaire    Within the last year, have you been afraid of your partner or ex-partner?: No    Within the last year, have you been humiliated or emotionally abused in other ways by your partner or ex-partner?: No    Within the last year, have you been kicked, hit, slapped, or otherwise physically hurt by your partner or ex-partner?: No    Within the last year, have you been raped or forced to have any kind of sexual activity by your partner or ex-partner?: No    FAMILY HISTORY: Family History  Problem Relation Age of Onset   Hypertension Maternal Grandmother    Diabetes Maternal Grandmother    Diabetes Maternal Grandfather    Hypertension Maternal Grandfather     Cancer Mother        cervical   Hypertension Brother    Asthma Daughter    ADD / ADHD Daughter     ALLERGIES:  is allergic to ibuprofen .  MEDICATIONS:  Current Outpatient Medications  Medication Sig Dispense Refill   amphetamine-dextroamphetamine (ADDERALL) 30 MG tablet Take 30 mg by mouth 2 (two) times daily.     ARIPiprazole (ABILIFY) 5 MG tablet Take 5 mg by mouth in the morning.     B Complex-C (B-COMPLEX WITH VITAMIN C) tablet Take 1 tablet by mouth every Monday.     cetirizine (ZYRTEC) 10 MG tablet Take 10 mg by mouth daily.     COSENTYX SENSOREADY, 300 MG, 150 MG/ML SOAJ Inject 150 mg into the skin every 30 (thirty)  days.     cycloSPORINE (RESTASIS) 0.05 % ophthalmic emulsion 1 drop 2 (two) times daily as needed (dry/irritated eyes.).     dicyclomine  (BENTYL ) 10 MG capsule Take 1 capsule (10 mg total) by mouth every 12 (twelve) hours as needed (abdominal pain). 60 capsule 5   famotidine (PEPCID) 20 MG tablet Take 20 mg by mouth at bedtime.     fesoterodine (TOVIAZ) 8 MG TB24 tablet Take 8 mg by mouth in the morning.     fluticasone (FLONASE) 50 MCG/ACT nasal spray Place 2 sprays into both nostrils daily.     folic acid  (FOLVITE ) 1 MG tablet TAKE 1 TABLET(1 MG) BY MOUTH DAILY 90 tablet 0   GNP VITAMIN B-1 100 MG tablet Take 100 mg by mouth daily.     HYDROcodone -acetaminophen  (NORCO/VICODIN) 5-325 MG tablet Take 2 tablets by mouth 2 (two) times daily.     hydrOXYzine (VISTARIL) 25 MG capsule Take 50 mg by mouth 2 (two) times daily.     levothyroxine (SYNTHROID) 175 MCG tablet Take 175 mcg by mouth daily before breakfast.     medroxyPROGESTERone  Acetate 150 MG/ML SUSY Inject 1 mL into the muscle every 30 (thirty) days.     naltrexone (DEPADE) 50 MG tablet Take 75 mg by mouth daily.     nystatin ointment (MYCOSTATIN) APPLY TO THE AFFECTED AREA(S) TWICE DAILY X7 DAYS; Duration: 7     ondansetron  (ZOFRAN -ODT) 4 MG disintegrating tablet Take 4 mg by mouth daily as needed for nausea or  vomiting.     pantoprazole (PROTONIX) 40 MG tablet Take 40 mg by mouth every morning.     Prenatal Vit-Fe Fumarate-FA (MULTIVITAMIN-PRENATAL) 27-0.8 MG TABS tablet Take 1 tablet by mouth in the morning.     QUEtiapine (SEROQUEL) 300 MG tablet Take 300 mg by mouth at bedtime.     thiamine (VITAMIN B1) 100 MG tablet Take 1 tablet by mouth daily.     vitamin B-12 (CYANOCOBALAMIN ) 1000 MCG tablet Take 1 tablet (1,000 mcg total) by mouth daily. 90 tablet 0   Vitamin D , Ergocalciferol , (DRISDOL) 1.25 MG (50000 UNIT) CAPS capsule Take 50,000 Units by mouth once a week.     No current facility-administered medications for this visit.    REVIEW OF SYSTEMS:   Constitutional: Denies fevers, chills or abnormal night sweats Eyes: Denies blurriness of vision, double vision or watery eyes Ears, nose, mouth, throat, and face: Denies mucositis or sore throat Respiratory: Denies cough, dyspnea or wheezes Cardiovascular: Denies palpitation, chest discomfort or lower extremity swelling Gastrointestinal:  Denies nausea, heartburn or change in bowel habits Skin: Denies abnormal skin rashes Lymphatics: Denies new lymphadenopathy or easy bruising Neurological:Denies numbness, tingling or new weaknesses Behavioral/Psych: Mood is stable, no new changes  All other systems were reviewed with the patient and are negative.  PHYSICAL EXAMINATION: ECOG PERFORMANCE STATUS: 1 - Symptomatic but completely ambulatory  There were no vitals filed for this visit. There were no vitals filed for this visit.  GENERAL:alert, no distress and comfortable SKIN: skin color, texture, turgor are normal, no rashes or significant lesions EYES: normal, conjunctiva are pink and non-injected, sclera clear OROPHARYNX:no exudate, no erythema and lips, buccal mucosa, and tongue normal  NECK: supple, thyroid normal size, non-tender, without nodularity LYMPH:  no palpable lymphadenopathy in the cervical, axillary or inguinal LUNGS: clear  to auscultation and percussion with normal breathing effort HEART: regular rate & rhythm and no murmurs and no lower extremity edema ABDOMEN:abdomen soft, non-tender and normal bowel sounds Musculoskeletal:no cyanosis  of digits and no clubbing  PSYCH: alert & oriented x 3 with fluent speech NEURO: no focal motor/sensory deficits  Physical Exam   LABORATORY DATA:  I have reviewed the data as listed    Latest Ref Rng & Units 09/04/2021   12:39 PM 01/21/2019    8:59 PM 01/14/2019   11:13 AM  CBC  WBC 4.0 - 10.5 K/uL 3.1  7.4  5.7   Hemoglobin 12.0 - 15.0 g/dL 88.1  89.8  89.1   Hematocrit 36.0 - 46.0 % 34.2  33.2  37.4   Platelets 150 - 400 K/uL 174  370  319     @cmpl @  RADIOGRAPHIC STUDIES: I have personally reviewed the radiological images as listed and agreed with the findings in the report. No results found.   Assessment & Plan Iron deficiency without anemia Iron deficiency likely related to gastric bypass surgery and menstrual periods. Ferritin levels have been intermittently low, with a significant drop three months ago. Currently not anemic, but iron levels need to be monitored. Oral iron supplementation has been ongoing since gastric bypass surgery, but absorption may be inadequate due to surgical history. - Ordered repeat iron level to assess current status - Ordered CBC to evaluate for anemia - Advised taking oral iron with orange juice to enhance absorption - Will consider IV iron if iron levels remain low  Vitamin B12 deficiency Vitamin B12 deficiency, currently taking B12 supplements daily. Previous low B12 levels noted. - Ordered B12 level to assess current status  History of gastric bypass surgery Gastric bypass surgery in 2015 with significant weight loss of 150 pounds. Post-surgery nutritional deficiencies noted, including iron and vitamin B12 deficiencies. Currently on vitamin B and D supplementation. - Continue current vitamin supplementation  regimen  Tobacco use Long-standing tobacco use, currently smoking half a pack per day since age 16. Discussed risks of smoking, including lung and head and neck cancer. She has not attempted cessation with medication previously. - Advised discussing smoking cessation options with primary care provider - Consider medications like Chantix for smoking cessation  Plan -lab today  -will call her next week to review lab results and determine next step   Orders Placed This Encounter  Procedures   CBC with Differential/Platelet    Standing Status:   Future    Number of Occurrences:   1    Expected Date:   07/15/2024    Expiration Date:   07/15/2025   Ferritin    Standing Status:   Future    Number of Occurrences:   1    Expected Date:   07/15/2024    Expiration Date:   07/15/2025   Vitamin B12    Standing Status:   Future    Number of Occurrences:   1    Expiration Date:   07/15/2025   Iron and TIBC ([facility] ONLY)    Standing Status:   Future    Expected Date:   07/15/2024    Expiration Date:   07/15/2025    All questions were answered. The patient knows to call the clinic with any problems, questions or concerns. I spent 25 minutes counseling the patient face to face. The total time spent in the appointment was 30 minutes including review of chart and various tests results, discussions about plan of care and coordination of care plan.     Onita Mattock, MD 07/15/2024 9:05 AM   Addendum I called patient and reviewed her lab results.  Anemia has resolved, her  ferritin was 11, slightly low, B12 was normal.  She is tolerating oral B12 and iron well, will continue.  Schedule lab and follow-up with NP in 6 months at [facility] office.  She agrees with the plan.  Onita Mattock   07/26/2024

## 2024-08-29 ENCOUNTER — Encounter: Payer: Self-pay | Admitting: *Deleted

## 2025-01-18 ENCOUNTER — Inpatient Hospital Stay

## 2025-01-25 ENCOUNTER — Inpatient Hospital Stay: Admitting: Oncology
